# Patient Record
Sex: Male | Born: 1980 | Race: White | Hispanic: No | Marital: Married | Smoking: Never smoker
Health system: Southern US, Community
[De-identification: ages and names within clinical notes are randomized; demographics above are authoritative.]

## PROBLEM LIST (undated history)

## (undated) DIAGNOSIS — E559 Vitamin D deficiency, unspecified: Secondary | ICD-10-CM

## (undated) DIAGNOSIS — I10 Essential (primary) hypertension: Secondary | ICD-10-CM

## (undated) DIAGNOSIS — F419 Anxiety disorder, unspecified: Secondary | ICD-10-CM

## (undated) DIAGNOSIS — F1010 Alcohol abuse, uncomplicated: Secondary | ICD-10-CM

## (undated) DIAGNOSIS — F10982 Alcohol use, unspecified with alcohol-induced sleep disorder: Secondary | ICD-10-CM

## (undated) HISTORY — DX: Essential (primary) hypertension: I10

## (undated) HISTORY — DX: Anxiety disorder, unspecified: F41.9

## (undated) HISTORY — PX: NO PAST SURGERIES: SHX2092

## (undated) HISTORY — DX: Alcohol abuse, uncomplicated: F10.10

## (undated) HISTORY — DX: Alcohol use, unspecified with alcohol-induced sleep disorder: F10.982

## (undated) HISTORY — DX: Vitamin D deficiency, unspecified: E55.9

---

## 2017-10-20 ENCOUNTER — Encounter (HOSPITAL_BASED_OUTPATIENT_CLINIC_OR_DEPARTMENT_OTHER): Payer: Self-pay | Admitting: Emergency Medicine

## 2017-10-20 ENCOUNTER — Emergency Department (HOSPITAL_BASED_OUTPATIENT_CLINIC_OR_DEPARTMENT_OTHER)
Admission: EM | Admit: 2017-10-20 | Discharge: 2017-10-20 | Disposition: A | Discharge status: Home / Self Care | Payer: 59 | Attending: Emergency Medicine | Admitting: Emergency Medicine

## 2017-10-20 ENCOUNTER — Other Ambulatory Visit: Payer: Self-pay

## 2017-10-20 DIAGNOSIS — T675XXA Heat exhaustion, unspecified, initial encounter: Secondary | ICD-10-CM | POA: Diagnosis not present

## 2017-10-20 DIAGNOSIS — Y9389 Activity, other specified: Secondary | ICD-10-CM | POA: Diagnosis not present

## 2017-10-20 DIAGNOSIS — X58XXXA Exposure to other specified factors, initial encounter: Secondary | ICD-10-CM | POA: Insufficient documentation

## 2017-10-20 DIAGNOSIS — L03114 Cellulitis of left upper limb: Secondary | ICD-10-CM | POA: Insufficient documentation

## 2017-10-20 DIAGNOSIS — Y92007 Garden or yard of unspecified non-institutional (private) residence as the place of occurrence of the external cause: Secondary | ICD-10-CM | POA: Diagnosis not present

## 2017-10-20 DIAGNOSIS — Y999 Unspecified external cause status: Secondary | ICD-10-CM | POA: Diagnosis not present

## 2017-10-20 DIAGNOSIS — R112 Nausea with vomiting, unspecified: Secondary | ICD-10-CM | POA: Insufficient documentation

## 2017-10-20 DIAGNOSIS — S61402A Unspecified open wound of left hand, initial encounter: Secondary | ICD-10-CM | POA: Diagnosis present

## 2017-10-20 LAB — CBC WITH DIFFERENTIAL/PLATELET
Basophils Absolute: 0 10*3/uL (ref 0.0–0.1)
Basophils Relative: 0 %
EOS PCT: 1 %
Eosinophils Absolute: 0.1 10*3/uL (ref 0.0–0.7)
HEMATOCRIT: 45.5 % (ref 39.0–52.0)
Hemoglobin: 16.4 g/dL (ref 13.0–17.0)
LYMPHS ABS: 1.6 10*3/uL (ref 0.7–4.0)
LYMPHS PCT: 22 %
MCH: 35.1 pg — AB (ref 26.0–34.0)
MCHC: 36 g/dL (ref 30.0–36.0)
MCV: 97.4 fL (ref 78.0–100.0)
MONO ABS: 1.2 10*3/uL — AB (ref 0.1–1.0)
Monocytes Relative: 17 %
NEUTROS ABS: 4.4 10*3/uL (ref 1.7–7.7)
Neutrophils Relative %: 60 %
PLATELETS: 201 10*3/uL (ref 150–400)
RBC: 4.67 MIL/uL (ref 4.22–5.81)
RDW: 12.7 % (ref 11.5–15.5)
WBC: 7.3 10*3/uL (ref 4.0–10.5)

## 2017-10-20 LAB — BASIC METABOLIC PANEL WITH GFR
Anion gap: 15 (ref 5–15)
BUN: 14 mg/dL (ref 6–20)
CO2: 24 mmol/L (ref 22–32)
Calcium: 10.1 mg/dL (ref 8.9–10.3)
Chloride: 93 mmol/L — ABNORMAL LOW (ref 101–111)
Creatinine, Ser: 1.1 mg/dL (ref 0.61–1.24)
GFR calc Af Amer: >60 mL/min
GFR calc non Af Amer: >60 mL/min
Glucose, Bld: 89 mg/dL (ref 65–99)
Potassium: 3.4 mmol/L — ABNORMAL LOW (ref 3.5–5.1)
Sodium: 132 mmol/L — ABNORMAL LOW (ref 135–145)

## 2017-10-20 MED ORDER — SODIUM CHLORIDE 0.9 % IV BOLUS
1000.0000 mL | Freq: Once | INTRAVENOUS | Status: AC
  Administered 2017-10-20: 1000 mL via INTRAVENOUS

## 2017-10-20 MED ORDER — SULFAMETHOXAZOLE-TRIMETHOPRIM 800-160 MG PO TABS
1.0000 | ORAL_TABLET | Freq: Once | ORAL | Status: AC
  Administered 2017-10-20: 1 via ORAL
  Filled 2017-10-20: qty 1

## 2017-10-20 MED ORDER — ONDANSETRON HCL 4 MG/2ML IJ SOLN
4.0000 mg | Freq: Once | INTRAMUSCULAR | Status: AC
  Administered 2017-10-20: 4 mg via INTRAVENOUS
  Filled 2017-10-20: qty 2

## 2017-10-20 MED ORDER — ONDANSETRON 4 MG PO TBDP: 4.0000 mg | ORAL_TABLET | Freq: Three times a day (TID) | ORAL | 0 refills | Status: DC | PRN

## 2017-10-20 MED ORDER — SULFAMETHOXAZOLE-TRIMETHOPRIM 800-160 MG PO TABS: 1.0000 | ORAL_TABLET | Freq: Two times a day (BID) | ORAL | 0 refills | Status: AC

## 2017-10-20 NOTE — ED Provider Notes (Signed)
[facility] EMERGENCY DEPARTMENT Provider Note   CSN: [id number] Arrival date & time: 10/20/17  1754     History   Chief Complaint Chief Complaint  Patient presents with  . Wound Check    HPI James Trujillo is a 37 y.o. male.  Pt presents to the ED today with a wound to his left hand and n/v.  Pt said he was outside yesterday in the hot sun all day.  He woke up this morning with n/v and headache.  He noticed that he had a possible spider bite to his left hand.  The pt said the redness around the bite is worsening.     History reviewed. No pertinent past medical history.  There are no active problems to display for this patient.   History reviewed. No pertinent surgical history.      Home Medications    Prior to Admission medications   Medication Sig Start Date End Date Taking? Authorizing Provider  ondansetron (ZOFRAN ODT) 4 MG disintegrating tablet Take 1 tablet (4 mg total) by mouth every 8 (eight) hours as needed. 10/20/17   Isla Pence, MD  sulfamethoxazole-trimethoprim (BACTRIM DS,SEPTRA DS) 800-160 MG tablet Take 1 tablet by mouth 2 (two) times daily for 7 days. 10/20/17 10/27/17  Isla Pence, MD    Family History No family history on file.  Social History Social History   Tobacco Use  . Smoking status: Never Smoker  . Smokeless tobacco: Never Used  Substance Use Topics  . Alcohol use: Yes    Alcohol/week: 21.6 oz    Types: 36 Standard drinks or equivalent per week    Comment: occ  . Drug use: Never     Allergies   Bee venom   Review of Systems Review of Systems  Gastrointestinal: Positive for nausea and vomiting.  Skin: Positive for wound.  All other systems reviewed and are negative.    Physical Exam Updated Vital Signs BP (!) 160/114 (BP Location: Left Arm)   Pulse 86   Temp 98.3 F (36.8 C) (Oral)   Resp 20   Ht 5\' 9"  (1.753 m)   Wt 83.9 kg (185 lb)   SpO2 98%   BMI 27.32 kg/m   Physical Exam  Constitutional:  He is oriented to person, place, and time. He appears well-developed and well-nourished.  HENT:  Head: Normocephalic and atraumatic.  Right Ear: External ear normal.  Left Ear: External ear normal.  Nose: Nose normal.  Mouth/Throat: Oropharynx is clear and moist.  Eyes: Pupils are equal, round, and reactive to light. Conjunctivae and EOM are normal.  Neck: Normal range of motion. Neck supple.  Cardiovascular: Normal rate, regular rhythm, normal heart sounds and intact distal pulses.  Pulmonary/Chest: Effort normal and breath sounds normal.  Abdominal: Soft. Bowel sounds are normal.  Musculoskeletal: Normal range of motion.  Neurological: He is alert and oriented to person, place, and time.  Skin: Skin is warm. Capillary refill takes less than 2 seconds.  Left hand cellulitis.  No abscess.  Psychiatric: He has a normal mood and affect. His behavior is normal. Judgment and thought content normal.  Nursing note and vitals reviewed.    ED Treatments / Results  Labs (all labs ordered are listed, but only abnormal results are displayed) Labs Reviewed  BASIC METABOLIC PANEL - Abnormal; Notable for the following components:      Result Value   Sodium 132 (*)    Potassium 3.4 (*)    Chloride 93 (*)  All other components within normal limits  CBC WITH DIFFERENTIAL/PLATELET - Abnormal; Notable for the following components:   MCH 35.1 (*)    Monocytes Absolute 1.2 (*)    All other components within normal limits    EKG None  Radiology No results found.  Procedures Procedures (including critical care time)  Medications Ordered in ED Medications  sodium chloride 0.9 % bolus 1,000 mL (1,000 mLs Intravenous New Bag/Given 10/20/17 1938)  sulfamethoxazole-trimethoprim (BACTRIM DS,SEPTRA DS) 800-160 MG per tablet 1 tablet (has no administration in time range)  ondansetron (ZOFRAN) injection 4 mg (4 mg Intravenous Given 10/20/17 1939)     Initial Impression / Assessment and Plan /  ED Course  I have reviewed the triage vital signs and the nursing notes.  Pertinent labs & imaging results that were available during my care of the patient were reviewed by me and considered in my medical decision making (see chart for details).     Pt is feeling much better.  He is tolerating po fluids.  He is instructed to return if worse.   Final Clinical Impressions(s) / ED Diagnoses   Final diagnoses:  Cellulitis of left upper extremity  Non-intractable vomiting with nausea, unspecified vomiting type  Heat exhaustion, initial encounter    ED Discharge Orders        Ordered    ondansetron (ZOFRAN ODT) 4 MG disintegrating tablet  Every 8 hours PRN     10/20/17 2006    sulfamethoxazole-trimethoprim (BACTRIM DS,SEPTRA DS) 800-160 MG tablet  2 times daily     10/20/17 2006       Isla Pence, MD 10/20/17 2010

## 2017-10-20 NOTE — ED Triage Notes (Signed)
Patient states that he woke up yesterday morning with a wound to his left hand. He is unsure if it a insect bite or not. THe patient also states that he is having some N/V/ and blurred vision with generalized aches

## 2018-10-14 ENCOUNTER — Other Ambulatory Visit: Payer: Self-pay

## 2018-10-14 ENCOUNTER — Emergency Department (HOSPITAL_BASED_OUTPATIENT_CLINIC_OR_DEPARTMENT_OTHER): Payer: 59

## 2018-10-14 ENCOUNTER — Encounter (HOSPITAL_BASED_OUTPATIENT_CLINIC_OR_DEPARTMENT_OTHER): Payer: Self-pay | Admitting: Emergency Medicine

## 2018-10-14 ENCOUNTER — Emergency Department (HOSPITAL_BASED_OUTPATIENT_CLINIC_OR_DEPARTMENT_OTHER)
Admission: EM | Admit: 2018-10-14 | Discharge: 2018-10-14 | Disposition: A | Discharge status: Home / Self Care | Payer: 59 | Attending: Emergency Medicine | Admitting: Emergency Medicine

## 2018-10-14 DIAGNOSIS — Z20828 Contact with and (suspected) exposure to other viral communicable diseases: Secondary | ICD-10-CM | POA: Diagnosis not present

## 2018-10-14 DIAGNOSIS — Z20822 Contact with and (suspected) exposure to covid-19: Secondary | ICD-10-CM

## 2018-10-14 DIAGNOSIS — R509 Fever, unspecified: Secondary | ICD-10-CM | POA: Diagnosis present

## 2018-10-14 DIAGNOSIS — R0602 Shortness of breath: Secondary | ICD-10-CM | POA: Diagnosis not present

## 2018-10-14 DIAGNOSIS — R05 Cough: Secondary | ICD-10-CM | POA: Insufficient documentation

## 2018-10-14 DIAGNOSIS — R0789 Other chest pain: Secondary | ICD-10-CM | POA: Insufficient documentation

## 2018-10-14 LAB — CBC
HCT: 47.6 % (ref 39.0–52.0)
Hemoglobin: 16.3 g/dL (ref 13.0–17.0)
MCH: 32.2 pg (ref 26.0–34.0)
MCHC: 34.2 g/dL (ref 30.0–36.0)
MCV: 94.1 fL (ref 80.0–100.0)
Platelets: 258 10*3/uL (ref 150–400)
RBC: 5.06 MIL/uL (ref 4.22–5.81)
RDW: 11.8 % (ref 11.5–15.5)
WBC: 8.2 10*3/uL (ref 4.0–10.5)
nRBC: 0 % (ref 0.0–0.2)

## 2018-10-14 LAB — BASIC METABOLIC PANEL
Anion gap: 16 — ABNORMAL HIGH (ref 5–15)
BUN: 7 mg/dL (ref 6–20)
CO2: 24 mmol/L (ref 22–32)
Calcium: 10.4 mg/dL — ABNORMAL HIGH (ref 8.9–10.3)
Chloride: 96 mmol/L — ABNORMAL LOW (ref 98–111)
Creatinine, Ser: 1 mg/dL (ref 0.61–1.24)
GFR calc Af Amer: >60 mL/min (ref >60)
GFR calc non Af Amer: >60 mL/min (ref >60)
Glucose, Bld: 118 mg/dL — ABNORMAL HIGH (ref 70–99)
Potassium: 3.3 mmol/L — ABNORMAL LOW (ref 3.5–5.1)
Sodium: 136 mmol/L (ref 135–145)

## 2018-10-14 LAB — D-DIMER, QUANTITATIVE (NOT AT ARMC): D-Dimer, Quant: 0.39 ug/mL-FEU (ref 0.00–0.50)

## 2018-10-14 LAB — TROPONIN I: Troponin I: <0.03 ng/mL (ref <0.03)

## 2018-10-14 MED ORDER — HYDROXYZINE HCL 25 MG PO TABS: 25.0000 mg | ORAL_TABLET | Freq: Every evening | ORAL | 0 refills | Status: DC | PRN

## 2018-10-14 MED ORDER — KETOROLAC TROMETHAMINE 30 MG/ML IJ SOLN
30.0000 mg | Freq: Once | INTRAMUSCULAR | Status: AC
  Administered 2018-10-14: 03:00:00 30 mg via INTRAVENOUS

## 2018-10-14 MED ORDER — SODIUM CHLORIDE 0.9% FLUSH
3.0000 mL | Freq: Once | INTRAVENOUS | Status: DC
  Filled 2018-10-14: qty 3

## 2018-10-14 MED ORDER — ONDANSETRON 4 MG PO TBDP: 4.0000 mg | ORAL_TABLET | Freq: Three times a day (TID) | ORAL | 0 refills | Status: DC | PRN

## 2018-10-14 MED ORDER — KETOROLAC TROMETHAMINE 30 MG/ML IJ SOLN
30.0000 mg | Freq: Once | INTRAMUSCULAR | Status: DC
  Filled 2018-10-14: qty 1

## 2018-10-14 NOTE — ED Notes (Signed)
Pt was given a urinal incase he needed to void. Pt was also given phone if staff needs to call and update him. He was handed the tv remote and lights dim to help milu.

## 2018-10-14 NOTE — ED Notes (Signed)
Pt understood dc material. NAD Noted. Scripts given at [employer]. All questions answered to satisfaction. Pt escorted to check out counter

## 2018-10-14 NOTE — ED Triage Notes (Signed)
C/o chest burning in center and to the left of chest that started today.  Reports being sick for the last two weeks with chills, fever, night sweats, and some cp with sob.

## 2018-10-14 NOTE — Discharge Instructions (Addendum)
You were seen today for chest pain.  Your work-up is reassuring.  Your COVID testing is pending.  Continue to isolate at home.  Symptomatically treat yourself.  Make sure to stay hydrated.  If you develop new or worsening symptoms you should be reevaluated.

## 2018-10-14 NOTE — ED Provider Notes (Signed)
[facility] EMERGENCY DEPARTMENT Provider Note   CSN: [id number] Arrival date & time: 10/14/18  0222    History   Chief Complaint Chief Complaint  Patient presents with   Chest Pain    HPI James Trujillo is a 38 y.o. male.     HPI  This is a 38 year old male who presents with chest pain.  Patient reports 2-week history of upper respiratory symptoms including fevers, cough, night sweats.  He states "I think I probably have COVID."  He reports recently traveling to [place] to take care of her friend who had a broken leg.  He states that the friend stayed in hospital and in a rehab facility.  No documented COVID exposures.  Patient has been isolating for the last 2 weeks.  Reports today he developed chest pain.  He states that it is burning in nature in the center of the chest.  He describes it as nonradiating.  Currently he rates his pain at 6 out of 10.  He has not had any recent fevers.  Denies any lower extremity swelling.  Did drive to [place].  Denies any history of heart disease, hypertension, diabetes.  History reviewed. No pertinent past medical history.  There are no active problems to display for this patient.   History reviewed. No pertinent surgical history.      Home Medications    Prior to Admission medications   Medication Sig Start Date End Date Taking? Authorizing Provider  hydrOXYzine (ATARAX/VISTARIL) 25 MG tablet Take 1 tablet (25 mg total) by mouth at bedtime as needed. 10/14/18   Richanda Darin, Barbette Hair, MD  ondansetron (ZOFRAN ODT) 4 MG disintegrating tablet Take 1 tablet (4 mg total) by mouth every 8 (eight) hours as needed. 10/14/18   Juleah Paradise, Barbette Hair, MD    Family History No family history on file.  Social History Social History   Tobacco Use   Smoking status: Never Smoker   Smokeless tobacco: Never Used  Substance Use Topics   Alcohol use: Yes    Alcohol/week: 36.0 standard drinks    Types: 36 Standard drinks or equivalent per  week    Comment: occ   Drug use: Never     Allergies   Bee venom   Review of Systems Review of Systems  Constitutional: Positive for chills and fever.  Respiratory: Positive for cough and shortness of breath.   Cardiovascular: Positive for chest pain. Negative for leg swelling.  Gastrointestinal: Negative for abdominal pain, nausea and vomiting.  Genitourinary: Negative for dysuria.  Musculoskeletal: Positive for myalgias.  Neurological: Negative for headaches.  All other systems reviewed and are negative.    Physical Exam Updated Vital Signs BP (!) 158/109    Pulse 90    Temp 98.6 F (37 C) (Oral)    Resp 18    Ht 1.753 m (5\' 9" )    Wt 83.9 kg    SpO2 97%    BMI 27.32 kg/m   Physical Exam Vitals signs and nursing note reviewed.  Constitutional:      Appearance: He is well-developed. He is not ill-appearing.  HENT:     Head: Normocephalic and atraumatic.  Eyes:     Pupils: Pupils are equal, round, and reactive to light.  Neck:     Musculoskeletal: Neck supple.  Cardiovascular:     Rate and Rhythm: Normal rate and regular rhythm.     Heart sounds: Normal heart sounds. No murmur.  Pulmonary:     Effort: Pulmonary  effort is normal. No tachypnea or respiratory distress.     Breath sounds: Normal breath sounds. No wheezing.  Abdominal:     General: Bowel sounds are normal.     Palpations: Abdomen is soft.     Tenderness: There is no abdominal tenderness. There is no rebound.  Musculoskeletal:     Right lower leg: He exhibits no tenderness. No edema.     Left lower leg: He exhibits no tenderness. No edema.  Lymphadenopathy:     Cervical: No cervical adenopathy.  Skin:    General: Skin is warm and dry.  Neurological:     Mental Status: He is alert and oriented to person, place, and time.  Psychiatric:        Mood and Affect: Mood normal.      ED Treatments / Results  Labs (all labs ordered are listed, but only abnormal results are displayed) Labs Reviewed    BASIC METABOLIC PANEL - Abnormal; Notable for the following components:      Result Value   Potassium 3.3 (*)    Chloride 96 (*)    Glucose, Bld 118 (*)    Calcium 10.4 (*)    Anion gap 16 (*)    All other components within normal limits  NOVEL CORONAVIRUS, NAA (HOSPITAL ORDER, SEND-OUT TO REF LAB)  CBC  TROPONIN I  D-DIMER, QUANTITATIVE (NOT AT [facility])    EKG EKG Interpretation  Date/Time:  Monday Oct 14 2018 02:34:08 EDT Ventricular Rate:  100 PR Interval:    QRS Duration: 83 QT Interval:  333 QTC Calculation: 430 R Axis:   64 Text Interpretation:  Sinus tachycardia Confirmed by Thayer Jew [phone]) on 10/14/2018 2:35:42 AM   Radiology Dg Chest Portable 1 View  Result Date: 10/14/2018 CLINICAL DATA:  Chest pain and shortness of breath. EXAM: PORTABLE CHEST 1 VIEW COMPARISON:  None. FINDINGS: Normal heart size and mediastinal contours. No acute infiltrate or edema. No effusion or pneumothorax. No acute osseous findings. IMPRESSION: Negative chest. Electronically Signed   By: Monte Fantasia M.D.   On: 10/14/2018 04:06    Procedures Procedures (including critical care time)  Medications Ordered in ED Medications  sodium chloride flush (NS) 0.9 % injection 3 mL (3 mLs Intravenous Not Given 10/14/18 0242)  ketorolac (TORADOL) 30 MG/ML injection 30 mg (30 mg Intravenous Given 10/14/18 6213)     Initial Impression / Assessment and Plan / ED Course  I have reviewed the triage vital signs and the nursing notes.  Pertinent labs & imaging results that were available during my care of the patient were reviewed by me and considered in my medical decision making (see chart for details).        Patient presents with chest pain.  Also concern for possible COVID infection.  He has visited a high risk area.  He is overall nontoxic-appearing.  He is afebrile.  Vital signs are largely reassuring.  He is not ill-appearing.  Physical exam is benign.  Considerations include acute  upper respiratory infection, could have an infection, PE given recent travel.  Less likely ACS.  Pneumonia is also consideration.  EKG shows no evidence of arrhythmia or ischemia.  Chest x-ray shows no evidence of pneumothorax or pneumonia.  Troponin is negative.  D-dimer is negative.  BMP and CBC are both largely reassuring.  He does have a mild anion gap but this is likely related to low chloride.  No other significant metabolic derangements.  Patient was updated.  COVID testing is  pending.  Recommend continued isolation and supportive measures at home.  After history, exam, and medical workup I feel the patient has been appropriately medically screened and is safe for discharge home. Pertinent diagnoses were discussed with the patient. Patient was given return precautions.  Abdulla Pooley was evaluated in Emergency Department on 10/14/2018 for the symptoms described in the history of present illness. He was evaluated in the context of the global COVID-19 pandemic, which necessitated consideration that the patient might be at risk for infection with the SARS-CoV-2 virus that causes COVID-19. Institutional protocols and algorithms that pertain to the evaluation of patients at risk for COVID-19 are in a state of rapid change based on information released by regulatory bodies including the CDC and federal and state organizations. These policies and algorithms were followed during the patient's care in the ED.   Final Clinical Impressions(s) / ED Diagnoses   Final diagnoses:  Atypical chest pain  Suspected Covid-19 Virus Infection    ED Discharge Orders         Ordered    ondansetron (ZOFRAN ODT) 4 MG disintegrating tablet  Every 8 hours PRN     10/14/18 0425    hydrOXYzine (ATARAX/VISTARIL) 25 MG tablet  At bedtime PRN     10/14/18 0425           Merryl Hacker, MD 10/14/18 0430

## 2018-10-14 NOTE — ED Notes (Signed)
X-ray at bedside

## 2018-10-15 LAB — NOVEL CORONAVIRUS, NAA (HOSP ORDER, SEND-OUT TO REF LAB; TAT 18-24 HRS): SARS-CoV-2, NAA: NOT DETECTED

## 2020-08-24 IMAGING — DX PORTABLE CHEST - 1 VIEW
1 series · 1 of 1 positions shown · non-contrast
Comparison: None.

CLINICAL DATA: Chest pain and shortness of breath.

EXAM:
PORTABLE CHEST 1 VIEW

[chest ap]
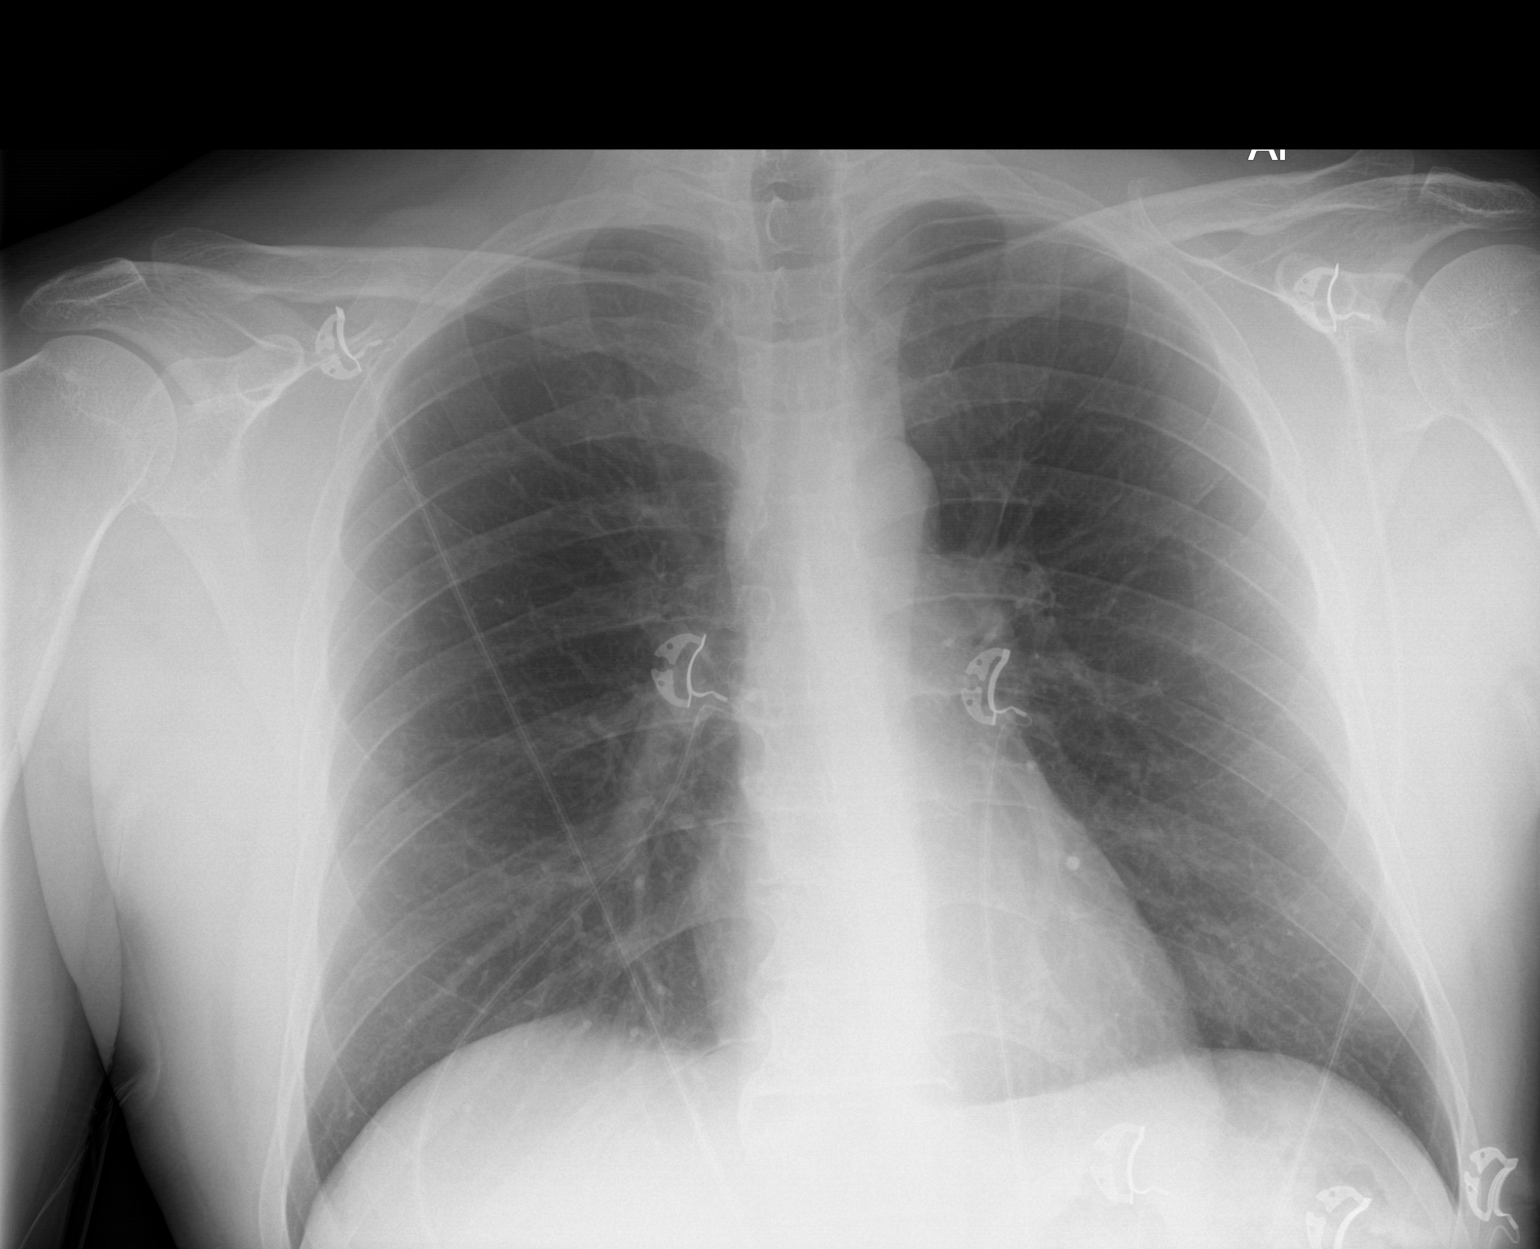

[1 of 1 positions shown; findings below may reference images not displayed]

FINDINGS: Normal heart size and mediastinal contours. No acute infiltrate or
edema. No effusion or pneumothorax. No acute osseous findings.
IMPRESSION: Negative chest.

## 2020-09-11 ENCOUNTER — Other Ambulatory Visit: Payer: Self-pay

## 2020-09-11 ENCOUNTER — Emergency Department (HOSPITAL_BASED_OUTPATIENT_CLINIC_OR_DEPARTMENT_OTHER)
Admission: EM | Admit: 2020-09-11 | Discharge: 2020-09-11 | Disposition: A | Discharge status: Home / Self Care | Payer: 59 | Attending: Emergency Medicine | Admitting: Emergency Medicine

## 2020-09-11 ENCOUNTER — Encounter (HOSPITAL_BASED_OUTPATIENT_CLINIC_OR_DEPARTMENT_OTHER): Payer: Self-pay | Admitting: Emergency Medicine

## 2020-09-11 DIAGNOSIS — I10 Essential (primary) hypertension: Secondary | ICD-10-CM | POA: Insufficient documentation

## 2020-09-11 DIAGNOSIS — R519 Headache, unspecified: Secondary | ICD-10-CM | POA: Diagnosis present

## 2020-09-11 MED ORDER — AMLODIPINE BESYLATE 5 MG PO TABS: 5.0000 mg | ORAL_TABLET | Freq: Every day | ORAL | 1 refills | Status: DC

## 2020-09-11 NOTE — ED Triage Notes (Signed)
Reports having dental work a few times the last 5 weeks.  BP elevated at dental office.  Also realized he has had mild headache and blurred vision for the last few months.  Unsure if related.

## 2020-09-11 NOTE — ED Provider Notes (Signed)
[facility] EMERGENCY DEPARTMENT Provider Note   CSN: [id number] Arrival date & time: 09/11/20  1543     History Chief Complaint  Patient presents with  . Hypertension  . Headache    James Trujillo is a 40 y.o. male.  The history is provided by the patient.  Hypertension This is a new problem. The current episode started more than 1 week ago. The problem occurs daily. Associated symptoms include headaches. Pertinent negatives include no chest pain, no abdominal pain and no shortness of breath. Nothing aggravates the symptoms. Nothing relieves the symptoms. He has tried nothing for the symptoms. The treatment provided no relief.       History reviewed. No pertinent past medical history.  There are no problems to display for this patient.   History reviewed. No pertinent surgical history.     No family history on file.  Social History   Tobacco Use  . Smoking status: Never Smoker  . Smokeless tobacco: Never Used  Substance Use Topics  . Alcohol use: Yes    Alcohol/week: 36.0 standard drinks    Types: 36 Standard drinks or equivalent per week    Comment: occ  . Drug use: Never    Home Medications Prior to Admission medications   Medication Sig Start Date End Date Taking? Authorizing Provider  amLODipine (NORVASC) 5 MG tablet Take 1 tablet (5 mg total) by mouth daily. 09/11/20 10/11/20 Yes Melonee Gerstel, DO  hydrOXYzine (ATARAX/VISTARIL) 25 MG tablet Take 1 tablet (25 mg total) by mouth at bedtime as needed. 10/14/18   Horton, Barbette Hair, MD  ondansetron (ZOFRAN ODT) 4 MG disintegrating tablet Take 1 tablet (4 mg total) by mouth every 8 (eight) hours as needed. 10/14/18   Horton, Barbette Hair, MD    Allergies    Bee venom  Review of Systems   Review of Systems  Constitutional: Negative for chills and fever.  HENT: Negative for ear pain and sore throat.   Eyes: Negative for pain and visual disturbance.  Respiratory: Negative for cough and shortness of  breath.   Cardiovascular: Negative for chest pain and palpitations.  Gastrointestinal: Negative for abdominal pain and vomiting.  Genitourinary: Negative for dysuria and hematuria.  Musculoskeletal: Negative for arthralgias and back pain.  Skin: Negative for color change and rash.  Neurological: Positive for headaches. Negative for seizures and syncope.  All other systems reviewed and are negative.   Physical Exam Updated Vital Signs  ED Triage Vitals  Enc Vitals Group     BP 09/11/20 1551 (!) 174/123     Pulse Rate 09/11/20 1551 91     Resp 09/11/20 1551 18     Temp 09/11/20 1551 98.4 F (36.9 C)     Temp Source 09/11/20 1551 Oral     SpO2 09/11/20 1551 100 %     Weight 09/11/20 1552 180 lb (81.6 kg)     Height 09/11/20 1552 5\' 9"  (1.753 m)     Head Circumference --      Peak Flow --      Pain Score 09/11/20 1552 4     Pain Loc --      Pain Edu? --      Excl. in [place]? --     Physical Exam Vitals and nursing note reviewed.  Constitutional:      General: He is not in acute distress.    Appearance: He is well-developed. He is not ill-appearing.  HENT:     Head: Normocephalic and atraumatic.  Mouth/Throat:     Mouth: Mucous membranes are moist.  Eyes:     Extraocular Movements: Extraocular movements intact.     Right eye: Normal extraocular motion and no nystagmus.     Left eye: Normal extraocular motion and no nystagmus.     Conjunctiva/sclera: Conjunctivae normal.     Pupils:     Right eye: Pupil is reactive.     Left eye: Pupil is reactive.  Cardiovascular:     Rate and Rhythm: Normal rate and regular rhythm.     Heart sounds: Normal heart sounds. No murmur heard.   Pulmonary:     Effort: Pulmonary effort is normal. No respiratory distress.     Breath sounds: Normal breath sounds.  Abdominal:     Palpations: Abdomen is soft.     Tenderness: There is no abdominal tenderness.  Musculoskeletal:     Cervical back: Normal range of motion and neck supple.   Skin:    General: Skin is warm and dry.  Neurological:     Mental Status: He is alert and oriented to person, place, and time.     Cranial Nerves: No cranial nerve deficit.     Sensory: No sensory deficit.     Motor: No weakness.     Comments: 5+ out of 5 strength throughout, normal sensation, no drift, normal finger-nose-finger, normal speech  Psychiatric:        Mood and Affect: Mood normal.     ED Results / Procedures / Treatments   Labs (all labs ordered are listed, but only abnormal results are displayed) Labs Reviewed - No data to display  EKG None  Radiology No results found.  Procedures Procedures   Medications Ordered in ED Medications - No data to display  ED Course  I have reviewed the triage vital signs and the nursing notes.  Pertinent labs & imaging results that were available during my care of the patient were reviewed by me and considered in my medical decision making (see chart for details).    MDM Rules/Calculators/A&P                          James Trujillo is a 40 year old male who presents to the ED with high blood pressure.  Blood pressure 174/120.  Blood pressure has been elevated multiple times over the last week greater than 150/90.  Has had some intermittent headaches during this time.  Has no stroke symptoms on exam.  Normal neurological exam.  No chest pain.  Overall likely primary blood pressure issue.  We will start him on amlodipine given the multiple elevated blood pressures.  Patient does have significant alcohol use.  Spent a good amount of time talking to him about his alcohol use and dietary modifications.  Eats a high salt diet as well.  Overall believe he will improve with dietary modifications, lifestyle improvements and will start amlodipine.  Given information to follow-up with primary care.  Understands return precautions and discharged in ED in good condition.  This chart was dictated using voice recognition software.  Despite best  efforts to proofread,  errors can occur which can change the documentation meaning.    Final Clinical Impression(s) / ED Diagnoses Final diagnoses:  Hypertension, unspecified type    Rx / DC Orders ED Discharge Orders         Ordered    amLODipine (NORVASC) 5 MG tablet  Daily        09/11/20 1613  Lennice Sites, DO 09/11/20 1620

## 2020-09-13 ENCOUNTER — Telehealth: Payer: Self-pay

## 2020-09-13 NOTE — Telephone Encounter (Signed)
Patient wondering if you would take him as a new pt.

## 2020-09-13 NOTE — Telephone Encounter (Signed)
That is okay, needs to be seen within the next 10 days for ER follow-up

## 2020-09-13 NOTE — Telephone Encounter (Signed)
Please advise 

## 2020-09-20 NOTE — Telephone Encounter (Signed)
lvm to schedule appt.  

## 2020-09-29 NOTE — Telephone Encounter (Signed)
lvm to schedule an appt 

## 2020-10-01 DIAGNOSIS — I10 Essential (primary) hypertension: Secondary | ICD-10-CM | POA: Insufficient documentation

## 2020-10-04 ENCOUNTER — Other Ambulatory Visit: Payer: Self-pay

## 2020-10-04 ENCOUNTER — Ambulatory Visit (INDEPENDENT_AMBULATORY_CARE_PROVIDER_SITE_OTHER): Payer: 59 | Admitting: Family Medicine

## 2020-10-04 ENCOUNTER — Encounter: Payer: Self-pay | Admitting: Family Medicine

## 2020-10-04 VITALS — BP 122/82 | HR 83 | Temp 97.9°F | Ht 69.0 in | Wt 186.2 lb

## 2020-10-04 DIAGNOSIS — Z789 Other specified health status: Secondary | ICD-10-CM | POA: Insufficient documentation

## 2020-10-04 DIAGNOSIS — I10 Essential (primary) hypertension: Secondary | ICD-10-CM

## 2020-10-04 DIAGNOSIS — Z7289 Other problems related to lifestyle: Secondary | ICD-10-CM | POA: Diagnosis not present

## 2020-10-04 LAB — COMPREHENSIVE METABOLIC PANEL
ALT: 50 U/L (ref 0–53)
AST: 40 U/L — ABNORMAL HIGH (ref 0–37)
Albumin: 4.8 g/dL (ref 3.5–5.2)
Alkaline Phosphatase: 68 U/L (ref 39–117)
BUN: 11 mg/dL (ref 6–23)
CO2: 27 mEq/L (ref 19–32)
Calcium: 9.4 mg/dL (ref 8.4–10.5)
Chloride: 101 mEq/L (ref 96–112)
Creatinine, Ser: 0.84 mg/dL (ref 0.40–1.50)
GFR: 109.01 mL/min (ref >60.00)
Glucose, Bld: 85 mg/dL (ref 70–99)
Potassium: 4.3 mEq/L (ref 3.5–5.1)
Sodium: 139 mEq/L (ref 135–145)
Total Bilirubin: 1 mg/dL (ref 0.2–1.2)
Total Protein: 7.5 g/dL (ref 6.0–8.3)

## 2020-10-04 NOTE — Progress Notes (Signed)
Chief Complaint  Patient presents with  . Hospitalization Follow-up  . Hypertension       New Patient Visit SUBJECTIVE: HPI: James Trujillo is an 40 y.o.male who is being seen for establishing care.  Hypertension Patient presents for hypertension follow up. He does monitor home blood pressures. Blood pressures ranging on average from 120's/80's. He is compliant with medication- Norvasc 5 mg/d. Patient has these side effects of medication: none He is sometimes adhering to a healthy diet overall. Exercise: none No CP or SOB.   Alcohol use Over the past 2 years, the patient has increased his alcohol use.  He ranges from 2-7 drinks nightly, with him consuming around 4-5 drinks nightly on average.  Some days he will not drink at all, usually 1 or 2 days of the week.  He does not have any sweating, shaking, or intense cravings.  He has not had a primary care physician in several years, last blood work was done 2 years ago that did show elevated liver enzymes.  Past Medical History:  Diagnosis Date  . Essential hypertension    Past Surgical History:  Procedure Laterality Date  . NO PAST SURGERIES     Family History  Problem Relation Age of Onset  . Hypertension Mother   . Melanoma Mother   . Hypertension Father   . Prostate cancer Father 36  . Asthma Father   . Asthma Brother    Allergies  Allergen Reactions  . Bee Venom Swelling    Current Outpatient Medications:  .  amLODipine (NORVASC) 5 MG tablet, Take 1 tablet (5 mg total) by mouth daily., Disp: 30 tablet, Rfl: 1  OBJECTIVE: BP 122/82 (BP Location: Left Arm, Patient Position: Sitting, Cuff Size: Normal)   Pulse 83   Temp 97.9 F (36.6 C) (Oral)   Ht 5\' 9"  (1.753 m)   Wt 186 lb 4 oz (84.5 kg)   SpO2 99%   BMI 27.50 kg/m  General:  well developed, well nourished, in no apparent distress Skin:  no significant moles, warts, or growths Lungs:  clear to auscultation, breath sounds equal bilaterally, no respiratory  distress Cardio:  regular rate and rhythm, no LE edema or bruits Psych: well oriented with normal range of affect and appropriate judgment/insight  ASSESSMENT/PLAN: Essential hypertension - Plan: Comprehensive metabolic panel  Alcohol use  1.  Chronic, new to this provider.  Check labs.  Continue Norvasc 5 mg daily.  Counseled on diet and exercise. DASH information provided.  2.  Chronic, needing to cut down.  I would like him to start cutting down his alcohol intake by half every 2 weeks.  Once he is to 1, maybe 2 drinks daily, he can stop.  If he starts having withdrawal symptoms, he will let me know. Patient should return as originally scheduled with his future regular PCP. The patient voiced understanding and agreement to the plan.   Hazel [place], DO 10/04/20  12:07 PM

## 2020-10-04 NOTE — Patient Instructions (Addendum)
Aim to do some physical exertion for 150 minutes per week. This is typically divided into 5 days per week, 30 minutes per day. The activity should be enough to get your heart rate up. Anything is better than nothing if you have time constraints.  Let's cut down on alcohol intake by half every 2 weeks until you are at 1-2 drinks daily and then stop.  If you have any issues with this, please let me know.   Give [place] 2-3 business days to get the results of your labs back.   Let us know if you need anything.   [email or web address]">  DASH Eating Plan DASH stands for Dietary Approaches to Stop Hypertension. The DASH eating plan is a healthy eating plan that has been shown to:  Reduce high blood pressure (hypertension).  Reduce your risk for type 2 diabetes, heart disease, and stroke.  Help with weight loss. What are tips for following this plan? Reading food labels  Check food labels for the amount of salt (sodium) per serving. Choose foods with less than 5 percent of the Daily Value of sodium. Generally, foods with less than 300 milligrams (mg) of sodium per serving fit into this eating plan.  To find whole grains, look for the word "whole" as the first word in the ingredient list. Shopping  Buy products labeled as "low-sodium" or "no salt added."  Buy fresh foods. Avoid canned foods and pre-made or frozen meals. Cooking  Avoid adding salt when cooking. Use salt-free seasonings or herbs instead of table salt or sea salt. Check with your health care provider or pharmacist before using salt substitutes.  Do not fry foods. Cook foods using healthy methods such as baking, boiling, grilling, roasting, and broiling instead.  Cook with heart-healthy oils, such as olive, canola, avocado, soybean, or sunflower oil. Meal planning  Eat a balanced diet that includes: ? 4 or more servings of fruits and 4 or more servings of vegetables each day. Try to  fill one-half of your plate with fruits and vegetables. ? 6-8 servings of whole grains each day. ? Less than 6 oz (170 g) of lean meat, poultry, or fish each day. A 3-oz (85-g) serving of meat is about the same size as a deck of cards. One egg equals 1 oz (28 g). ? 2-3 servings of low-fat dairy each day. One serving is 1 cup (237 mL). ? 1 serving of nuts, seeds, or beans 5 times each week. ? 2-3 servings of heart-healthy fats. Healthy fats called omega-3 fatty acids are found in foods such as walnuts, flaxseeds, fortified milks, and eggs. These fats are also found in cold-water fish, such as sardines, salmon, and mackerel.  Limit how much you eat of: ? Canned or prepackaged foods. ? Food that is high in trans fat, such as some fried foods. ? Food that is high in saturated fat, such as fatty meat. ? Desserts and other sweets, sugary drinks, and other foods with added sugar. ? Full-fat dairy products.  Do not salt foods before eating.  Do not eat more than 4 egg yolks a week.  Try to eat at least 2 vegetarian meals a week.  Eat more home-cooked food and less restaurant, buffet, and fast food.   Lifestyle  When eating at a restaurant, ask that your food be prepared with less salt or no salt, if possible.  If you drink alcohol: ? Limit how much you use to:  0-1 drink a day for women who are not  pregnant.  0-2 drinks a day for men. ? Be aware of how much alcohol is in your drink. In the [place] one drink equals one 12 oz bottle of beer (355 mL), one 5 oz glass of wine (148 mL), or one 1 oz glass of hard liquor (44 mL). General information  Avoid eating more than 2,300 mg of salt a day. If you have hypertension, you may need to reduce your sodium intake to 1,500 mg a day.  Work with your health care provider to maintain a healthy body weight or to lose weight. Ask what an ideal weight is for you.  Get at least 30 minutes of exercise that causes your heart to beat faster (aerobic  exercise) most days of the week. Activities may include walking, swimming, or biking.  Work with your health care provider or dietitian to adjust your eating plan to your individual calorie needs. What foods should I eat? Fruits All fresh, dried, or frozen fruit. Canned fruit in natural juice (without added sugar). Vegetables Fresh or frozen vegetables (raw, steamed, roasted, or grilled). Low-sodium or reduced-sodium tomato and vegetable juice. Low-sodium or reduced-sodium tomato sauce and tomato paste. Low-sodium or reduced-sodium canned vegetables. Grains Whole-grain or whole-wheat bread. Whole-grain or whole-wheat pasta. Brown rice. [place]. Bulgur. Whole-grain and low-sodium cereals. Pita bread. Low-fat, low-sodium crackers. Whole-wheat flour tortillas. Meats and other proteins Skinless chicken or [place]. Ground chicken or [place]. Pork with fat trimmed off. Fish and seafood. Egg whites. Dried beans, peas, or lentils. Unsalted nuts, nut butters, and seeds. Unsalted canned beans. Lean cuts of beef with fat trimmed off. Low-sodium, lean precooked or cured meat, such as sausages or meat loaves. Dairy Low-fat (1%) or fat-free (skim) milk. Reduced-fat, low-fat, or fat-free cheeses. Nonfat, low-sodium ricotta or cottage cheese. Low-fat or nonfat yogurt. Low-fat, low-sodium cheese. Fats and oils Soft margarine without trans fats. Vegetable oil. Reduced-fat, low-fat, or light mayonnaise and salad dressings (reduced-sodium). Canola, safflower, olive, avocado, soybean, and sunflower oils. Avocado. Seasonings and condiments Herbs. Spices. Seasoning mixes without salt. Other foods Unsalted popcorn and pretzels. Fat-free sweets. The items listed above may not be a complete list of foods and beverages you can eat. Contact a dietitian for more information. What foods should I avoid? Fruits Canned fruit in a light or heavy syrup. Fried fruit. Fruit in cream or butter sauce. Vegetables Creamed or  fried vegetables. Vegetables in a cheese sauce. Regular canned vegetables (not low-sodium or reduced-sodium). Regular canned tomato sauce and paste (not low-sodium or reduced-sodium). Regular tomato and vegetable juice (not low-sodium or reduced-sodium). Angie Fava. Olives. Grains Baked goods made with fat, such as croissants, muffins, or some breads. Dry pasta or rice meal packs. Meats and other proteins Fatty cuts of meat. Ribs. Fried meat. Berniece Salines. Bologna, salami, and other precooked or cured meats, such as sausages or meat loaves. Fat from the back of a pig (fatback). Bratwurst. Salted nuts and seeds. Canned beans with added salt. Canned or smoked fish. Whole eggs or egg yolks. Chicken or [place] with skin. Dairy Whole or 2% milk, cream, and half-and-half. Whole or full-fat cream cheese. Whole-fat or sweetened yogurt. Full-fat cheese. Nondairy creamers. Whipped toppings. Processed cheese and cheese spreads. Fats and oils Butter. Stick margarine. Lard. Shortening. Ghee. Bacon fat. Tropical oils, such as coconut, palm kernel, or palm oil. Seasonings and condiments Onion salt, garlic salt, seasoned salt, table salt, and sea salt. Worcestershire sauce. Tartar sauce. Barbecue sauce. Teriyaki sauce. Soy sauce, including reduced-sodium. Steak sauce. Canned and packaged gravies. Fish  sauce. Oyster sauce. Cocktail sauce. Store-bought horseradish. Ketchup. Mustard. Meat flavorings and tenderizers. Bouillon cubes. Hot sauces. Pre-made or packaged marinades. Pre-made or packaged taco seasonings. Relishes. Regular salad dressings. Other foods Salted popcorn and pretzels. The items listed above may not be a complete list of foods and beverages you should avoid. Contact a dietitian for more information. Where to find more information  National Heart, Lung, and Blood Institute: https://wilson-eaton.com/  American Heart Association: www.heart.org  Academy of Nutrition and Dietetics: www.eatright.[place]: www.kidney.org Summary  The DASH eating plan is a healthy eating plan that has been shown to reduce high blood pressure (hypertension). It may also reduce your risk for type 2 diabetes, heart disease, and stroke.  When on the DASH eating plan, aim to eat more fresh fruits and vegetables, whole grains, lean proteins, low-fat dairy, and heart-healthy fats.  With the DASH eating plan, you should limit salt (sodium) intake to 2,300 mg a day. If you have hypertension, you may need to reduce your sodium intake to 1,500 mg a day.  Work with your health care provider or dietitian to adjust your eating plan to your individual calorie needs. This information is not intended to replace advice given to you by your health care provider. Make sure you discuss any questions you have with your health care provider. Document Revised: 04/18/2019 Document Reviewed: 04/18/2019 Elsevier Patient Education  2021 [employer].

## 2020-11-09 ENCOUNTER — Ambulatory Visit (INDEPENDENT_AMBULATORY_CARE_PROVIDER_SITE_OTHER): Payer: 59 | Admitting: Internal Medicine

## 2020-11-09 ENCOUNTER — Other Ambulatory Visit: Payer: Self-pay

## 2020-11-09 VITALS — BP 126/84 | HR 81 | Temp 98.1°F | Resp 16 | Ht 69.0 in | Wt 189.0 lb

## 2020-11-09 DIAGNOSIS — Z114 Encounter for screening for human immunodeficiency virus [HIV]: Secondary | ICD-10-CM | POA: Diagnosis not present

## 2020-11-09 DIAGNOSIS — Z0000 Encounter for general adult medical examination without abnormal findings: Secondary | ICD-10-CM | POA: Insufficient documentation

## 2020-11-09 DIAGNOSIS — Z1159 Encounter for screening for other viral diseases: Secondary | ICD-10-CM | POA: Diagnosis not present

## 2020-11-09 DIAGNOSIS — I10 Essential (primary) hypertension: Secondary | ICD-10-CM | POA: Diagnosis not present

## 2020-11-09 DIAGNOSIS — Z09 Encounter for follow-up examination after completed treatment for conditions other than malignant neoplasm: Secondary | ICD-10-CM | POA: Insufficient documentation

## 2020-11-09 LAB — LIPID PANEL
Cholesterol: 189 mg/dL (ref 0–200)
HDL: 69.5 mg/dL (ref >39.00)
LDL Cholesterol: 101 mg/dL — ABNORMAL HIGH (ref 0–99)
NonHDL: 119.74
Total CHOL/HDL Ratio: 3
Triglycerides: 93 mg/dL (ref 0.0–149.0)
VLDL: 18.6 mg/dL (ref 0.0–40.0)

## 2020-11-09 LAB — CBC WITH DIFFERENTIAL/PLATELET
Basophils Absolute: 0 10*3/uL (ref 0.0–0.1)
Basophils Relative: 0.6 % (ref 0.0–3.0)
Eosinophils Absolute: 0.1 10*3/uL (ref 0.0–0.7)
Eosinophils Relative: 2.1 % (ref 0.0–5.0)
HCT: 43.3 % (ref 39.0–52.0)
Hemoglobin: 14.6 g/dL (ref 13.0–17.0)
Lymphocytes Relative: 44.7 % (ref 12.0–46.0)
Lymphs Abs: 1.6 10*3/uL (ref 0.7–4.0)
MCHC: 33.7 g/dL (ref 30.0–36.0)
MCV: 98 fl (ref 78.0–100.0)
Monocytes Absolute: 0.4 10*3/uL (ref 0.1–1.0)
Monocytes Relative: 10.4 % (ref 3.0–12.0)
Neutro Abs: 1.5 10*3/uL (ref 1.4–7.7)
Neutrophils Relative %: 42.2 % — ABNORMAL LOW (ref 43.0–77.0)
Platelets: 224 10*3/uL (ref 150.0–400.0)
RBC: 4.42 Mil/uL (ref 4.22–5.81)
RDW: 12.9 % (ref 11.5–15.5)
WBC: 3.7 10*3/uL — ABNORMAL LOW (ref 4.0–10.5)

## 2020-11-09 LAB — TSH: TSH: 1.87 u[IU]/mL (ref 0.35–4.50)

## 2020-11-09 MED ORDER — AMLODIPINE BESYLATE 5 MG PO TABS: 5.0000 mg | ORAL_TABLET | Freq: Every day | ORAL | 3 refills | Status: DC

## 2020-11-09 NOTE — Assessment & Plan Note (Signed)
Tdap: We will get records Covid vax: none, had covid infex dx x 2, vax pro>>cons, encouraged to proceed. CCS: no FH, never had aCscope FH prostate ca, GF and F at age 40.  No symptoms Labs: FLP, CBC, TSH, HIV, hep C Diet exercise: Doing great with diet, encouraged to exercise more

## 2020-11-09 NOTE — Progress Notes (Signed)
Subjective:    Patient ID: James Trujillo, male    DOB: 04/20/1981, 40 y.o.   MRN: [id number]  DOS:  11/09/2020 Type of visit - description:  To get  establish, requests a CPX   The patient was accepted by me but initially saw Dr. Nani Ravens on 10/04/2020. At the time he was evaluated for hypertension.  CMP was done and it was normal except for a slightly elevated AST (40).  Was noted to have induration on the left hand, this started few years ago and is slowly growing.  No symptoms  Review of Systems  Other than above, a 14 point review of systems is negative      Past Medical History:  Diagnosis Date   Essential hypertension    ETOH abuse     Past Surgical History:  Procedure Laterality Date   NO PAST SURGERIES     Social History   Socioeconomic History   Marital status: Divorced    Spouse name: Not on file   Number of children: 0   Years of education: Not on file   Highest education level: Not on file  Occupational History   Occupation: Freight forwarder,  Chief Financial Officer  Tobacco Use   Smoking status: Never   Smokeless tobacco: Never  Substance and Sexual Activity   Alcohol use: Not Currently    Comment: 3-4 drinks a day   Drug use: Never   Sexual activity: Yes    Partners: Female  Other Topics Concern   Not on file  Social History Narrative   Moved from [place] ~ 2019   Lives by self    Social Determinants of Health   Financial Resource Strain: Not on file  Food Insecurity: Not on file  Transportation Needs: Not on file  Physical Activity: Not on file  Stress: Not on file  Social Connections: Not on file  Intimate Partner Violence: Not on file    Allergies as of 11/09/2020       Reactions   Bee Venom Swelling   Ciprofloxacin Other (See Comments)   tendonitis        Medication List        Accurate as of November 09, 2020  6:11 PM. If you have any questions, ask your nurse or doctor.          amLODipine 5 MG tablet Commonly known as:  NORVASC Take 1 tablet (5 mg total) by mouth daily.           Objective:   Physical Exam BP 126/84 (BP Location: Left Arm, Patient Position: Sitting, Cuff Size: Small)   Pulse 81   Temp 98.1 F (36.7 C) (Oral)   Resp 16   Ht 5\' 9"  (1.753 m)   Wt 189 lb (85.7 kg)   SpO2 96%   BMI 27.91 kg/m  General: Well developed, NAD, BMI noted Neck: No  thyromegaly  HEENT:  Normocephalic . Face symmetric, atraumatic Lungs:  CTA B Normal respiratory effort, no intercostal retractions, no accessory muscle use. Heart: RRR,  no murmur.  Abdomen:  Not distended, soft, non-tender. No rebound or rigidity.   Lower extremities: no pretibial edema bilaterally L hand: hard tissue proximal to fourth finger.  No mobility issues. Skin: Exposed areas without rash. Not pale. Not jaundice Neurologic:  alert & oriented X3.  Speech normal, gait appropriate for age and unassisted Strength symmetric and appropriate for age.  Psych: Cognition and judgment appear intact.  Cooperative with normal attention span and concentration.  Behavior  appropriate. No anxious or depressed appearing.     Assessment    Assessment (new patient 10/2020) HTN dx 08/2020 H/o heavy ETOH , going through a divorce FH prostate ca, F age 10 Covid infex x 2 (last 05/2020) Dupuytren's  PLAN Here for CPX HTN: BP was noted to be elevated at his dentist and at home in the last few months, went to the ER 08-2020, started amlodipine.  Since then BP is well controlled and he feels great. EKG today: NSR Plan: Refill amlodipine. EtOH: History of abuse, has cut down significantly in the last year, still drinking more than 2 drinks daily, patient is counseled. Dupuytren's: Few years history of very hard tissue of the left hand, suspect Dupuytren's, she has no symptoms, we agree on wait for now, refer to hand surgery at some point. RTC 1 year  This visit occurred during the SARS-CoV-2 public health emergency.  Safety protocols were  in place, including screening questions prior to the visit, additional usage of staff PPE, and extensive cleaning of exam room while observing appropriate contact time as indicated for disinfecting solutions.

## 2020-11-09 NOTE — Patient Instructions (Addendum)
Check the  blood pressure   monthly  BP GOAL is between 110/65 and  135/85. If it is consistently higher or lower, let me know   GO TO THE LAB : Get the blood work     [place], [place] back for   for a physical by 10/2021

## 2020-11-09 NOTE — Assessment & Plan Note (Signed)
Here for CPX HTN: BP was noted to be elevated at his dentist and at home in the last few months, went to the ER 08-2020, started amlodipine.  Since then BP is well controlled and he feels great. EKG today: NSR Plan: Refill amlodipine. EtOH: History of abuse, has cut down significantly in the last year, still drinking more than 2 drinks daily, patient is counseled. Dupuytren's: Few years history of very hard tissue of the left hand, suspect Dupuytren's, she has no symptoms, we agree on wait for now, refer to hand surgery at some point. RTC 1 year

## 2020-11-10 LAB — HIV ANTIBODY (ROUTINE TESTING W REFLEX): HIV 1&2 Ab, 4th Generation: NONREACTIVE

## 2020-11-10 LAB — HEPATITIS C ANTIBODY
Hepatitis C Ab: NONREACTIVE
SIGNAL TO CUT-OFF: 0 (ref <1.00)

## 2020-12-24 ENCOUNTER — Telehealth: Payer: Self-pay

## 2020-12-24 NOTE — Telephone Encounter (Signed)
Received medical records from Dr. Frazier Butt Pole Ojea in [place], [place]. Records placed in PCP yellow folder for review.

## 2020-12-30 NOTE — Telephone Encounter (Signed)
Multiple pages of records reviewed, many were from 2011. He does have a history of low vitamin D. No recent lab

## 2020-12-30 NOTE — Telephone Encounter (Signed)
Records sent for scanning

## 2021-10-19 ENCOUNTER — Encounter: Payer: Self-pay | Admitting: Internal Medicine

## 2021-12-08 ENCOUNTER — Other Ambulatory Visit: Payer: Self-pay

## 2021-12-08 ENCOUNTER — Emergency Department (HOSPITAL_BASED_OUTPATIENT_CLINIC_OR_DEPARTMENT_OTHER): Payer: 59

## 2021-12-08 ENCOUNTER — Emergency Department (HOSPITAL_BASED_OUTPATIENT_CLINIC_OR_DEPARTMENT_OTHER)
Admission: EM | Admit: 2021-12-08 | Discharge: 2021-12-09 | Disposition: A | Discharge status: Home / Self Care | Payer: 59 | Attending: Emergency Medicine | Admitting: Emergency Medicine

## 2021-12-08 ENCOUNTER — Encounter (HOSPITAL_BASED_OUTPATIENT_CLINIC_OR_DEPARTMENT_OTHER): Payer: Self-pay | Admitting: Emergency Medicine

## 2021-12-08 DIAGNOSIS — Z79899 Other long term (current) drug therapy: Secondary | ICD-10-CM | POA: Insufficient documentation

## 2021-12-08 DIAGNOSIS — R079 Chest pain, unspecified: Secondary | ICD-10-CM | POA: Diagnosis present

## 2021-12-08 DIAGNOSIS — R0789 Other chest pain: Secondary | ICD-10-CM | POA: Insufficient documentation

## 2021-12-08 DIAGNOSIS — I10 Essential (primary) hypertension: Secondary | ICD-10-CM | POA: Diagnosis not present

## 2021-12-08 LAB — BASIC METABOLIC PANEL
Anion gap: 10 (ref 5–15)
BUN: 13 mg/dL (ref 6–20)
CO2: 24 mmol/L (ref 22–32)
Calcium: 9.6 mg/dL (ref 8.9–10.3)
Chloride: 100 mmol/L (ref 98–111)
Creatinine, Ser: 0.91 mg/dL (ref 0.61–1.24)
GFR, Estimated: >60 mL/min (ref >60)
Glucose, Bld: 102 mg/dL — ABNORMAL HIGH (ref 70–99)
Potassium: 3.8 mmol/L (ref 3.5–5.1)
Sodium: 134 mmol/L — ABNORMAL LOW (ref 135–145)

## 2021-12-08 LAB — CBC
HCT: 46.6 % (ref 39.0–52.0)
Hemoglobin: 16.2 g/dL (ref 13.0–17.0)
MCH: 33.5 pg (ref 26.0–34.0)
MCHC: 34.8 g/dL (ref 30.0–36.0)
MCV: 96.3 fL (ref 80.0–100.0)
Platelets: 148 10*3/uL — ABNORMAL LOW (ref 150–400)
RBC: 4.84 MIL/uL (ref 4.22–5.81)
RDW: 12.8 % (ref 11.5–15.5)
WBC: 5.2 10*3/uL (ref 4.0–10.5)
nRBC: 0 % (ref 0.0–0.2)

## 2021-12-08 LAB — TROPONIN I (HIGH SENSITIVITY): Troponin I (High Sensitivity): 4 ng/L (ref <18)

## 2021-12-08 NOTE — ED Provider Notes (Signed)
[facility] Provider Note: Georgena Spurling, MD, FACEP  CSN: [id number] MRN: [id number] ARRIVAL: 12/08/21 at 2125 ROOM: [place]  Chest Pain   HISTORY OF PRESENT ILLNESS  12/08/21 11:58 PM James Trujillo is a 41 y.o. male who took a flight from [place] about 4 PM today.  The pain was very hot and he was sweating.  While on the plane he developed a slowly pulsating pain below his left nipple.  It was never severe and has subsequently improved after taking ibuprofen.  He thinks it was initially worse with deep breathing but is not sure because the sweaty airplane made it difficult to breathe anyway.  He is not having any pain or swelling in his legs.  He did recently move from [place].    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)    Anxiety    Essential hypertension    ETOH abuse    Vitamin D deficiency     Past Surgical History:  Procedure Laterality Date   NO PAST SURGERIES      Family History  Problem Relation Age of Onset   Hypertension Mother    Melanoma Mother    Prostate cancer Father 16   Hypertension Father    Asthma Father    Asthma Brother     Social History   Tobacco Use   Smoking status: Never   Smokeless tobacco: Never  Vaping Use   Vaping Use: Never used  Substance Use Topics   Alcohol use: Yes    Comment: 3-4 drinks a day   Drug use: Never    Prior to Admission medications   Medication Sig Start Date End Date Taking? Authorizing Provider  amLODipine (NORVASC) 5 MG tablet Take 1 tablet (5 mg total) by mouth daily. 11/09/20   Colon Branch, MD    Allergies Bee venom and Ciprofloxacin   REVIEW OF SYSTEMS  Negative except as noted here or in the History of Present Illness.   PHYSICAL EXAMINATION  Initial Vital Signs Blood pressure (!) 155/113, pulse 67, temperature 98.7 F (37.1 C), temperature source Oral, resp. rate 20, height '5\' 9"'$  (1.753 m), weight 83.9 kg, SpO2  100 %.  Examination General: Well-developed, well-nourished male in no acute distress; appearance consistent with age of record HENT: normocephalic; atraumatic Eyes: Normal appearance Neck: supple Heart: regular rate and rhythm Lungs: clear to auscultation bilaterally Abdomen: soft; nondistended; nontender; bowel sounds present Extremities: No deformity; full range of motion; pulses normal; no edema or calf tenderness Neurologic: Awake, alert and oriented; motor function intact in all extremities and symmetric; no facial droop Skin: Warm and dry Psychiatric: Normal mood and affect   RESULTS  Summary of this visit's results, reviewed and interpreted by myself:   EKG Interpretation  Date/Time:  Thursday December 08 2021 21:39:34 EDT Ventricular Rate:  70 PR Interval:  128 QRS Duration: 84 QT Interval:  408 QTC Calculation: 440 R Axis:   54 Text Interpretation: Normal sinus rhythm Nonspecific T wave abnormality Confirmed by Exavier Lina [phone]) on 12/08/2021 11:58:24 PM       Laboratory Studies: Results for orders placed or performed during the hospital encounter of 12/08/21 (from the past 24 hour(s))  Basic metabolic panel     Status: Abnormal   Collection Time: 12/08/21  9:41 PM  Result Value Ref Range   Sodium 134 (L) 135 - 145 mmol/L   Potassium 3.8 3.5 - 5.1 mmol/L  Chloride 100 98 - 111 mmol/L   CO2 24 22 - 32 mmol/L   Glucose, Bld 102 (H) 70 - 99 mg/dL   BUN 13 6 - 20 mg/dL   Creatinine, Ser 0.91 0.61 - 1.24 mg/dL   Calcium 9.6 8.9 - 10.3 mg/dL   GFR, Estimated >60 >60 mL/min   Anion gap 10 5 - 15  CBC     Status: Abnormal   Collection Time: 12/08/21  9:41 PM  Result Value Ref Range   WBC 5.2 4.0 - 10.5 K/uL   RBC 4.84 4.22 - 5.81 MIL/uL   Hemoglobin 16.2 13.0 - 17.0 g/dL   HCT 46.6 39.0 - 52.0 %   MCV 96.3 80.0 - 100.0 fL   MCH 33.5 26.0 - 34.0 pg   MCHC 34.8 30.0 - 36.0 g/dL   RDW 12.8 11.5 - 15.5 %   Platelets 148 (L) 150 - 400 K/uL   nRBC 0.0 0.0 - 0.2  %  Troponin I (High Sensitivity)     Status: None   Collection Time: 12/08/21  9:41 PM  Result Value Ref Range   Troponin I (High Sensitivity) 4 <18 ng/L  Troponin I (High Sensitivity)     Status: None   Collection Time: 12/09/21  1:09 AM  Result Value Ref Range   Troponin I (High Sensitivity) 4 <18 ng/L  D-dimer, quantitative     Status: None   Collection Time: 12/09/21  1:09 AM  Result Value Ref Range   D-Dimer, Quant 0.37 0.00 - 0.50 ug/mL-FEU   Imaging Studies: DG Chest 2 View  Result Date: 12/08/2021 CLINICAL DATA:  Chest pain. EXAM: CHEST - 2 VIEW COMPARISON:  Chest radiograph dated 10/14/2018. FINDINGS: The heart size and mediastinal contours are within normal limits. Both lungs are clear. The visualized skeletal structures are unremarkable. IMPRESSION: No active cardiopulmonary disease. Electronically Signed   By: Anner Crete M.D.   On: 12/08/2021 21:49    ED COURSE and MDM  Nursing notes, initial and subsequent vitals signs, including pulse oximetry, reviewed and interpreted by myself.  Vitals:   12/08/21 2135 12/08/21 2136 12/09/21 0030  BP:  (!) 155/113 (!) 153/111  Pulse:  67 69  Resp:  20 10  Temp:  98.7 F (37.1 C)   TempSrc:  Oral   SpO2:  100% 100%  Weight: 83.9 kg    Height: '5\' 9"'$  (1.753 m)     Medications - No data to display  Patient's pain is atypical for cardiac etiology.  His EKG is nonacute and his troponins are 4.  His D-dimer is within normal limits.  I believe he is safe for discharge at this time. HEART score 1.  PROCEDURES  Procedures   ED DIAGNOSES     ICD-10-CM   1. Atypical chest pain  R07.89          Shanon Rosser, MD 12/09/21 [phone]

## 2021-12-08 NOTE — ED Provider Notes (Incomplete)
[facility] Provider Note: Georgena Spurling, MD, FACEP  CSN: [id number] MRN: [id number] ARRIVAL: 12/08/21 at 2125 ROOM: [place]  Chest Pain   HISTORY OF PRESENT ILLNESS  12/08/21 11:58 PM James Trujillo is a 41 y.o. male    Past Medical History:  Diagnosis Date  . 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 Anxiety   . Essential hypertension   . ETOH abuse   . Vitamin D deficiency     Past Surgical History:  Procedure Laterality Date  . NO PAST SURGERIES      Family History  Problem Relation Age of Onset  . Hypertension Mother   . Melanoma Mother   . Prostate cancer Father 50  . Hypertension Father   . Asthma Father   . Asthma Brother     Social History   Tobacco Use  . Smoking status: Never  . Smokeless tobacco: Never  Vaping Use  . Vaping Use: Never used  Substance Use Topics  . Alcohol use: Yes    Comment: 3-4 drinks a day  . Drug use: Never    Prior to Admission medications   Medication Sig Start Date End Date Taking? Authorizing Provider  amLODipine (NORVASC) 5 MG tablet Take 1 tablet (5 mg total) by mouth daily. 11/09/20   Colon Branch, MD    Allergies Bee venom and Ciprofloxacin   REVIEW OF SYSTEMS  Negative except as noted here or in the History of Present Illness.   PHYSICAL EXAMINATION  Initial Vital Signs Blood pressure (!) 155/113, pulse 67, temperature 98.7 F (37.1 C), temperature source Oral, resp. rate 20, height '5\' 9"'$  (1.753 m), weight 83.9 kg, SpO2 100 %.  Examination General: Well-developed, well-nourished male in no acute distress; appearance consistent with age of record HENT: normocephalic; atraumatic Eyes: pupils equal, round and reactive to light; extraocular muscles intact Neck: supple Heart: regular rate and rhythm; no murmurs, rubs or gallops Lungs: clear to auscultation bilaterally Abdomen: soft; nondistended; nontender; no masses or hepatosplenomegaly; bowel sounds  present Extremities: No deformity; full range of motion; pulses normal Neurologic: Awake, alert and oriented; motor function intact in all extremities and symmetric; no facial droop Skin: Warm and dry Psychiatric: Normal mood and affect   RESULTS  Summary of this visit's results, reviewed and interpreted by myself:   EKG Interpretation  Date/Time:  Thursday December 08 2021 21:39:34 EDT Ventricular Rate:  70 PR Interval:  128 QRS Duration: 84 QT Interval:  408 QTC Calculation: 440 R Axis:   54 Text Interpretation: Normal sinus rhythm Nonspecific T wave abnormality Confirmed by Dekendrick Uzelac, Jenny Reichmann [phone]) on 12/08/2021 11:58:24 PM       Laboratory Studies: Results for orders placed or performed during the hospital encounter of 12/08/21 (from the past 24 hour(s))  Basic metabolic panel     Status: Abnormal   Collection Time: 12/08/21  9:41 PM  Result Value Ref Range   Sodium 134 (L) 135 - 145 mmol/L   Potassium 3.8 3.5 - 5.1 mmol/L   Chloride 100 98 - 111 mmol/L   CO2 24 22 - 32 mmol/L   Glucose, Bld 102 (H) 70 - 99 mg/dL   BUN 13 6 - 20 mg/dL   Creatinine, Ser 0.91 0.61 - 1.24 mg/dL   Calcium 9.6 8.9 - 10.3 mg/dL   GFR, Estimated >60 >60 mL/min   Anion gap 10 5 - 15  CBC     Status: Abnormal   Collection Time: 12/08/21  9:41 PM  Result Value Ref Range   WBC 5.2 4.0 - 10.5 K/uL   RBC 4.84 4.22 - 5.81 MIL/uL   Hemoglobin 16.2 13.0 - 17.0 g/dL   HCT 46.6 39.0 - 52.0 %   MCV 96.3 80.0 - 100.0 fL   MCH 33.5 26.0 - 34.0 pg   MCHC 34.8 30.0 - 36.0 g/dL   RDW 12.8 11.5 - 15.5 %   Platelets 148 (L) 150 - 400 K/uL   nRBC 0.0 0.0 - 0.2 %  Troponin I (High Sensitivity)     Status: None   Collection Time: 12/08/21  9:41 PM  Result Value Ref Range   Troponin I (High Sensitivity) 4 <18 ng/L   Imaging Studies: DG Chest 2 View  Result Date: 12/08/2021 CLINICAL DATA:  Chest pain. EXAM: CHEST - 2 VIEW COMPARISON:  Chest radiograph dated 10/14/2018. FINDINGS: The heart size and mediastinal  contours are within normal limits. Both lungs are clear. The visualized skeletal structures are unremarkable. IMPRESSION: No active cardiopulmonary disease. Electronically Signed   By: Anner Crete M.D.   On: 12/08/2021 21:49    ED COURSE and MDM  Nursing notes, initial and subsequent vitals signs, including pulse oximetry, reviewed and interpreted by myself.  Vitals:   12/08/21 2135 12/08/21 2136  BP:  (!) 155/113  Pulse:  67  Resp:  20  Temp:  98.7 F (37.1 C)  TempSrc:  Oral  SpO2:  100%  Weight: 83.9 kg   Height: '5\' 9"'$  (1.753 m)    Medications - No data to display    PROCEDURES  Procedures   ED DIAGNOSES  No diagnosis found.

## 2021-12-08 NOTE — ED Triage Notes (Signed)
Patient reports left-sided chest pain prior to taking off on a plane earlier, states pain has decreased, but is still present.

## 2021-12-09 LAB — TROPONIN I (HIGH SENSITIVITY): Troponin I (High Sensitivity): 4 ng/L (ref <18)

## 2021-12-09 LAB — D-DIMER, QUANTITATIVE: D-Dimer, Quant: 0.37 ug/mL-FEU (ref 0.00–0.50)

## 2021-12-21 ENCOUNTER — Other Ambulatory Visit: Payer: Self-pay | Admitting: Internal Medicine

## 2022-01-06 ENCOUNTER — Encounter: Payer: Self-pay | Admitting: Internal Medicine

## 2022-01-06 ENCOUNTER — Ambulatory Visit (INDEPENDENT_AMBULATORY_CARE_PROVIDER_SITE_OTHER): Payer: 59 | Admitting: Internal Medicine

## 2022-01-06 VITALS — BP 124/82 | HR 90 | Temp 98.7°F | Ht 69.0 in | Wt 192.8 lb

## 2022-01-06 DIAGNOSIS — Z789 Other specified health status: Secondary | ICD-10-CM

## 2022-01-06 DIAGNOSIS — M720 Palmar fascial fibromatosis [Dupuytren]: Secondary | ICD-10-CM | POA: Diagnosis not present

## 2022-01-06 DIAGNOSIS — D229 Melanocytic nevi, unspecified: Secondary | ICD-10-CM

## 2022-01-06 DIAGNOSIS — F419 Anxiety disorder, unspecified: Secondary | ICD-10-CM | POA: Diagnosis not present

## 2022-01-06 MED ORDER — CITALOPRAM HYDROBROMIDE 20 MG PO TABS: ORAL_TABLET | ORAL | 3 refills | Status: DC

## 2022-01-06 MED ORDER — AMLODIPINE BESYLATE 5 MG PO TABS
5.0000 mg | ORAL_TABLET | Freq: Every day | ORAL | 3 refills | Status: DC
Start: 2022-01-06 — End: 2023-01-30

## 2022-01-06 NOTE — Patient Instructions (Addendum)
Start Celexa (citalopram) 20 mg tablets: Half tablet at bedtime the first week Then 1 tablet at bedtime. Watch for side effects.  Check the  blood pressure regularly BP GOAL is between 110/65 and  135/85. If it is consistently higher or lower, let me know  See you in September for your physical

## 2022-01-06 NOTE — Progress Notes (Unsigned)
Subjective:    Patient ID: James Trujillo, male    DOB: [birth year], 41 y.o.   MRN: [id number]  DOS:  01/06/2022 Type of visit - description: Follow-up  Here for hypertension follow-up.  Needs a refill. Reports great ambulatory BPs.  Also: Dupuytren's is getting worse, referral? Noted a lump at the posterior scalp, only 2 to 3 months ago.  No discharge, no bleeding.  Anxiety: That is started approximately a couple of years ago by the time he got divorced, anything cut "set that off", he feels restless, anxious, nervous. Many family members have the same symptoms and they do well with Celexa Denies depression or suicidal ideas He thinks that anxiety is one of the reason he is overdrinking.  I did notice he went to the ER 12/08/2021 with chest pain,  D-dimer and troponin negative.  Chest x-ray negative.  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 HEENT:  Normocephalic . Face symmetric, atraumatic   Skin: Not pale. Not jaundice Neurologic:  alert & oriented X3.  Speech normal, gait appropriate for age and unassisted Psych--  Cognition and judgment appear intact.  Cooperative with normal attention span and concentration.  Behavior appropriate. No anxious or depressed appearing.      Assessment     Assessment (new patient 10/2020) HTN dx 08/2020 H/o heavy ETOH , going through a divorce FH prostate ca, F age  47 Covid infex x 2 (last 05/2020) Dupuytren's  PLAN HTN: Reports ambulatory BPs are always within normal.  Refill amlodipine Dupuytren's: Reports is getting worse and would like to explore nonsurgical therapies.  Refer to Ortho hand. Anxiety: This is the first time he mention it to me, symptoms started around the time he got divorced, denies depression no suicidal ideas, family member responded very well to Celexa, trial?. I agree, start Celexa 10 mg then 20 mg, see AVS, watch for side effects. See next EtOH: He thinks that treating anxiety will significantly help decrease alcohol intake.  He was completely sober last month but in the last couple of weeks he is drinking every night, once he started SSRIs recommend to stop EtOH or if he Mast do not go over 1 or 2 drinks.  He verbalized understanding. Skin lesion: See physical exam, new skin lesion, the area has been a scratch, mole?  Refer to dermatology. CPX schedule for next month   Here for CPX HTN: BP was noted to be elevated at his dentist and at home in the last few months, went to the ER 08-2020, started amlodipine.  Since then BP is well controlled and he feels great. EKG today: NSR Plan: Refill amlodipine. EtOH: History of abuse, has cut down significantly in the last  year, still drinking more than 2 drinks daily, patient is counseled. Dupuytren's: Few years history of very hard tissue of the left hand, suspect Dupuytren's, she has no symptoms, we agree on wait for now, refer to hand surgery at some point. RTC 1 year

## 2022-01-07 DIAGNOSIS — F419 Anxiety disorder, unspecified: Secondary | ICD-10-CM | POA: Insufficient documentation

## 2022-01-07 NOTE — Assessment & Plan Note (Signed)
HTN: Reports ambulatory BPs are always within normal.  Refill amlodipine Dupuytren's: Reports is getting worse and would like to explore nonsurgical therapies.  Refer to Ortho hand. Anxiety: This is the first time he mention it to me, symptoms started around the time he got divorced, denies depression no suicidal ideas, family member responded very well to Celexa, trial?. I agree, start Celexa 10 mg then 20 mg, see AVS, watch for side effects. See next EtOH: He thinks that treating anxiety will significantly help decrease alcohol intake.  He was completely sober last month but in the last couple of weeks he is drinking every night, once he starts SSRIs recommend to stop EtOH (or if he must, don't go over 1-2 servings). He verbalized understanding. Skin lesion: See physical exam, new skin lesion, the area has been a scratch, mole?  Refer to dermatology. CPX schedule for next month

## 2022-01-12 ENCOUNTER — Ambulatory Visit: Payer: 59 | Admitting: Orthopedic Surgery

## 2022-01-16 ENCOUNTER — Ambulatory Visit: Payer: 59 | Admitting: Orthopedic Surgery

## 2022-01-19 ENCOUNTER — Encounter: Payer: Self-pay | Admitting: Orthopedic Surgery

## 2022-01-19 ENCOUNTER — Ambulatory Visit (INDEPENDENT_AMBULATORY_CARE_PROVIDER_SITE_OTHER): Payer: 59 | Admitting: Orthopedic Surgery

## 2022-01-19 DIAGNOSIS — M720 Palmar fascial fibromatosis [Dupuytren]: Secondary | ICD-10-CM

## 2022-01-19 NOTE — Progress Notes (Signed)
Office Visit Note   Patient: James Trujillo           Date of Birth: 09/21/1980           MRN: [id number] Visit Date: 01/19/2022              Requested by: Colon Branch, [address] [place],   [id number] PCP: Colon Branch, MD   Assessment & Plan: Visit Diagnoses:  1. Dupuytren contracture     Plan: Patient has very mild Dupuytren's disease involving the left hand.  He has a palpable pretendinous cord and small nodule in line with the ring finger.  There is a very very small pit in the area.  There is no evidence of contracture.  We discussed the nature of Dupuytren's disease as well as the utility of the tabletop test.  At this point I would not recommend any treatment given that he has very mild disease and evidence of contracture.  He will keep an eye on his fingers and will return if his disease worsens.  Follow-Up Instructions: No follow-ups on file.   Orders:  No orders of the defined types were placed in this encounter.  No orders of the defined types were placed in this encounter.     Procedures: No procedures performed   Clinical Data: No additional findings.   Subjective: Chief Complaint  Patient presents with   Left Ring Finger - nodule   Right Ring Finger - nodule   Right Little Finger - nodule   nodule     This is a 41 year old right-hand-dominant male who presents with nodules in the left hand that were first noticed for 5 years ago.  Seen by his PCP who referred him to me for further evaluation.  He does have a family history of Dupuytren's disease.  He does not have any difficulty with use of his hands.  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  Is able to use wrenches and other tools without any difficulty.  He has no pain in his hand.    Review of Systems   Objective: Vital Signs: There were no vitals taken for this visit.  Physical Exam Constitutional:      Appearance: Normal appearance.   Cardiovascular:     Rate and Rhythm: Normal rate.     Pulses: Normal pulses.  Pulmonary:     Effort: Pulmonary effort is normal.  Skin:    General: Skin is warm and dry.     Capillary Refill: Capillary refill takes less than 2 seconds.  Neurological:     Mental Status: He is alert.     Left Hand Exam   Tenderness  The patient is experiencing no tenderness.   Range of Motion  The patient has normal left wrist ROM.  Other  Erythema: absent Sensation: normal Pulse: present  Comments:  Small pretendinous cord in line with ring finger with small palpable nodule and very subtle pit.  No evidence of contracture with full finger ROM and negative table top test.       Specialty Comments:  No specialty comments available.  Imaging: No results found.   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[place])    Anxiety    Essential hypertension    ETOH abuse    Vitamin D deficiency     Family History  Problem Relation Age of Onset   Hypertension Mother    Melanoma Mother    Prostate cancer Father 61   Hypertension Father    Asthma Father    Asthma Brother     Past Surgical History:  Procedure Laterality Date   NO PAST SURGERIES     Social History   Occupational History   Occupation: Freight forwarder,  Chief Financial Officer  Tobacco Use   Smoking status: Never   Smokeless tobacco: Never  Vaping Use   Vaping Use: Never used  Substance and Sexual Activity   Alcohol use: Yes    Comment: 3-4 drinks a day   Drug use: Never   Sexual activity: Yes    Partners: Female

## 2022-02-20 ENCOUNTER — Ambulatory Visit (INDEPENDENT_AMBULATORY_CARE_PROVIDER_SITE_OTHER): Payer: 59 | Admitting: Internal Medicine

## 2022-02-20 ENCOUNTER — Encounter: Payer: Self-pay | Admitting: Internal Medicine

## 2022-02-20 VITALS — BP 126/82 | HR 86 | Temp 98.1°F | Resp 16 | Ht 69.0 in | Wt 189.2 lb

## 2022-02-20 DIAGNOSIS — Z0001 Encounter for general adult medical examination with abnormal findings: Secondary | ICD-10-CM

## 2022-02-20 DIAGNOSIS — I10 Essential (primary) hypertension: Secondary | ICD-10-CM

## 2022-02-20 DIAGNOSIS — F1010 Alcohol abuse, uncomplicated: Secondary | ICD-10-CM

## 2022-02-20 DIAGNOSIS — Z789 Other specified health status: Secondary | ICD-10-CM | POA: Diagnosis not present

## 2022-02-20 DIAGNOSIS — Z0000 Encounter for general adult medical examination without abnormal findings: Secondary | ICD-10-CM | POA: Diagnosis not present

## 2022-02-20 DIAGNOSIS — F1090 Alcohol use, unspecified, uncomplicated: Secondary | ICD-10-CM

## 2022-02-20 MED ORDER — BUSPIRONE HCL 7.5 MG PO TABS: 7.5000 mg | ORAL_TABLET | Freq: Two times a day (BID) | ORAL | 1 refills | Status: DC

## 2022-02-20 NOTE — Patient Instructions (Addendum)
Vaccines I recommend: Flu shot COVID-vaccine  See below the resources you have for the treatment of alcohol abuse.  For anxiety: BuSpar 7.5 mg 1 tablet twice daily     GO TO THE LAB : Get the blood work     [place], [place] back for   a checkup in 6 weeks     Recommend to reach out for help for the treatment of alcohol abuse  [facility] Address: [address] [place], [place] [address] Hours:  Open 24 hours Phone: [phone] [facility]  [facility]  [facility]: [place], [place] [address] [place]: [phone] or [phone]

## 2022-02-20 NOTE — Progress Notes (Unsigned)
Subjective:    Patient ID: James Trujillo, male    DOB: [birth year], 41 y.o.   MRN: [id number]  DOS:  02/20/2022 Type of visit - description: CPX Here for CPX Anxiety: Tried citalopram, it made him worse. Denies depression no suicidal ideas. Physically feeling well. 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[place])    Anxiety    Essential hypertension    ETOH abuse    Vitamin D deficiency     Past Surgical History:  Procedure Laterality Date   NO PAST SURGERIES      Current Outpatient Medications  Medication Instructions   amLODipine (NORVASC) 5 mg, Oral, Daily   citalopram (CELEXA) 20 MG tablet 1/2 tablet at night x 1 week, then 1 tablet at night       Objective:   Physical Exam BP 126/82   Pulse 86   Temp 98.1 F (36.7 C) (Oral)   Resp 16   Ht '5\' 9"'$  (1.753 m)   Wt 189 lb 4 oz (85.8 kg)   SpO2 96%   BMI 27.95 kg/m  General: Well developed, NAD, BMI noted Neck: No  thyromegaly  HEENT:  Normocephalic . Face symmetric, atraumatic + EtOH breath Lungs:  CTA B Normal respiratory effort, no intercostal retractions, no accessory muscle use. Heart: RRR,  no murmur.  Abdomen:  Not distended, soft, non-tender. No rebound or rigidity.   Lower extremities: no pretibial edema bilaterally  Skin: Exposed areas without rash. Not pale. Not jaundice Neurologic:  alert & oriented X3.  Speech normal, gait appropriate for age and unassisted Strength symmetric and appropriate for age.  Psych: Cognition and judgment appear intact.  Cooperative with normal attention span and concentration.  Behavior appropriate. No anxious or depressed appearing.     Assessment    ASSESSMENT (new patient 10/2020) HTN dx 08/2020  ETOH abuse FH prostate ca, F age 44 Covid infex x 2 (last 05/2020) Dupuytren's  PLAN Here for CPX HTN: On amlodipine, BP is good. Anxiety: Try it citalopram for  few days, states made his symptoms worse.  "The only thing that works is Xanax". Advised patient Xanax is contraindicated due to his history of alcohol abuse.  We talk about other options and we agreed on BuSpar 7.5 mg twice daily.  Reassess in 6 weeks. Alcohol abuse: The patient reports that he "drinks because he is anxious", advised he has a condition called alcoholism, is not just anxiety.  As such, in addition to treat anxiety he needs to seek for help to quit alcohol.  Several contacts provided, see AVS. He verbalized understanding and said "I know that". RTC 6 weeks.  Structure    Tdap 2014   Since I recommend: COVID, flu shot. Pros >cons CCS: no FH, never had a Cscope FH prostate ca, GF and F at age 25.  No symptoms Labs:  CMP, FLP, CBC, K16, folic acid Diet exercise: Discussed.  8=23 HTN: Reports ambulatory BPs are always within normal.  Refill amlodipine Dupuytren's: Reports is getting worse and would like to explore nonsurgical therapies.  Refer to Ortho hand. Anxiety: This is the first time he mention it to me, symptoms started around the time he got divorced, denies depression no suicidal ideas, family member responded very well to Celexa, trial?. I agree, start Celexa 10 mg then 20 mg, see AVS, watch for side effects. See next EtOH: He thinks that treating  anxiety will significantly help decrease alcohol intake.  He was completely sober last month but in the last couple of weeks he is drinking every night, once he starts SSRIs recommend to stop EtOH (or if he must, don't go over 1-2 servings). He verbalized understanding. Skin lesion: See physical exam, new skin lesion, the area has been a scratch, mole?  Refer to dermatology. CPX schedule for next month

## 2022-02-21 ENCOUNTER — Encounter: Payer: Self-pay | Admitting: Internal Medicine

## 2022-02-21 LAB — LIPID PANEL
Cholesterol: 230 mg/dL — ABNORMAL HIGH (ref 0–200)
HDL: 104.4 mg/dL (ref >39.00)
LDL Cholesterol: 116 mg/dL — ABNORMAL HIGH (ref 0–99)
NonHDL: 126.02
Total CHOL/HDL Ratio: 2
Triglycerides: 48 mg/dL (ref 0.0–149.0)
VLDL: 9.6 mg/dL (ref 0.0–40.0)

## 2022-02-21 LAB — CBC WITH DIFFERENTIAL/PLATELET
Basophils Absolute: 0.1 10*3/uL (ref 0.0–0.1)
Basophils Relative: 1.1 % (ref 0.0–3.0)
Eosinophils Absolute: 0 10*3/uL (ref 0.0–0.7)
Eosinophils Relative: 0.4 % (ref 0.0–5.0)
HCT: 46.5 % (ref 39.0–52.0)
Hemoglobin: 15.7 g/dL (ref 13.0–17.0)
Lymphocytes Relative: 45.1 % (ref 12.0–46.0)
Lymphs Abs: 2 10*3/uL (ref 0.7–4.0)
MCHC: 33.8 g/dL (ref 30.0–36.0)
MCV: 101.1 fl — ABNORMAL HIGH (ref 78.0–100.0)
Monocytes Absolute: 0.4 10*3/uL (ref 0.1–1.0)
Monocytes Relative: 9.9 % (ref 3.0–12.0)
Neutro Abs: 1.9 10*3/uL (ref 1.4–7.7)
Neutrophils Relative %: 43.5 % (ref 43.0–77.0)
Platelets: 155 10*3/uL (ref 150.0–400.0)
RBC: 4.59 Mil/uL (ref 4.22–5.81)
RDW: 14.1 % (ref 11.5–15.5)
WBC: 4.4 10*3/uL (ref 4.0–10.5)

## 2022-02-21 LAB — COMPREHENSIVE METABOLIC PANEL
ALT: 105 U/L — ABNORMAL HIGH (ref 0–53)
AST: 122 U/L — ABNORMAL HIGH (ref 0–37)
Albumin: 4.9 g/dL (ref 3.5–5.2)
Alkaline Phosphatase: 74 U/L (ref 39–117)
BUN: 7 mg/dL (ref 6–23)
CO2: 26 mEq/L (ref 19–32)
Calcium: 9 mg/dL (ref 8.4–10.5)
Chloride: 103 mEq/L (ref 96–112)
Creatinine, Ser: 0.9 mg/dL (ref 0.40–1.50)
GFR: 105.73 mL/min (ref >60.00)
Glucose, Bld: 92 mg/dL (ref 70–99)
Potassium: 4 mEq/L (ref 3.5–5.1)
Sodium: 142 mEq/L (ref 135–145)
Total Bilirubin: 0.8 mg/dL (ref 0.2–1.2)
Total Protein: 7.8 g/dL (ref 6.0–8.3)

## 2022-02-21 LAB — B12 AND FOLATE PANEL
Folate: 5.7 ng/mL — ABNORMAL LOW (ref >5.9)
Vitamin B-12: 252 pg/mL (ref 211–911)

## 2022-02-21 NOTE — Assessment & Plan Note (Signed)
Here for CPX HTN: On amlodipine, BP is good. Anxiety: Tried  citalopram for few days, states made his symptoms worse.  "The only thing that works is Xanax". Advised patient Xanax is contraindicated due to his history of alcohol abuse.  We talk about other options and we agreed on BuSpar 7.5 mg twice daily.  Reassess in 6 weeks. Alcohol abuse: The patient reports that he "drinks because he is anxious", advised he has a condition called alcoholism, is not just anxiety.  As such, in addition to treat anxiety he needs to seek for help to quit alcohol.  Several contacts provided, see AVS. He verbalized understanding and said "I know that". He drinks approximately 6 beers a day, he had a drink this morning before coming to the office.  Advised about drinking and driving. RTC 6 weeks.

## 2022-02-21 NOTE — Assessment & Plan Note (Signed)
Tdap 2014   Since I recommend: COVID, flu shot. Pros >cons CCS: no FH, never had a Cscope FH prostate ca, GF and F at age 41.  No symptoms Labs:  CMP, FLP, CBC, K24, folic acid Diet exercise: Discussed.

## 2022-02-22 MED ORDER — FOLIC ACID 1 MG PO TABS: 1.0000 mg | ORAL_TABLET | Freq: Every day | ORAL | 3 refills | Status: DC

## 2022-02-22 NOTE — Addendum Note (Signed)
Addended byDamita Dunnings D on: 02/22/2022 07:58 AM   Modules accepted: Orders

## 2022-04-03 ENCOUNTER — Encounter: Payer: Self-pay | Admitting: Internal Medicine

## 2022-04-03 ENCOUNTER — Ambulatory Visit (INDEPENDENT_AMBULATORY_CARE_PROVIDER_SITE_OTHER): Payer: 59 | Admitting: Internal Medicine

## 2022-04-03 VITALS — BP 148/92 | HR 86 | Temp 98.1°F | Resp 16 | Ht 69.0 in | Wt 191.5 lb

## 2022-04-03 DIAGNOSIS — F1010 Alcohol abuse, uncomplicated: Secondary | ICD-10-CM | POA: Diagnosis not present

## 2022-04-03 DIAGNOSIS — F419 Anxiety disorder, unspecified: Secondary | ICD-10-CM | POA: Diagnosis not present

## 2022-04-03 DIAGNOSIS — R7989 Other specified abnormal findings of blood chemistry: Secondary | ICD-10-CM

## 2022-04-03 LAB — HEPATIC FUNCTION PANEL
ALT: 116 U/L — ABNORMAL HIGH (ref 0–53)
AST: 81 U/L — ABNORMAL HIGH (ref 0–37)
Albumin: 4.9 g/dL (ref 3.5–5.2)
Alkaline Phosphatase: 70 U/L (ref 39–117)
Bilirubin, Direct: 0.2 mg/dL (ref 0.0–0.3)
Total Bilirubin: 1.1 mg/dL (ref 0.2–1.2)
Total Protein: 7.5 g/dL (ref 6.0–8.3)

## 2022-04-03 NOTE — Progress Notes (Unsigned)
   Subjective:    Patient ID: James Trujillo, male    DOB: 04/13/1981, 41 y.o.   MRN: [id number]  DOS:  04/03/2022 Type of visit - description: Follow-up  To follow-up from previous visit We talk about EtOH, anxiety, LFTs. He is making some progress.   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 HEENT:  Normocephalic . Face symmetric, atraumatic Lower extremities: no pretibial edema bilaterally  Skin: Not pale. Not jaundice Neurologic:  alert & oriented X3.  Speech normal, gait appropriate for age and unassisted Psych--  Cognition and judgment appear intact.  Cooperative with normal attention span and concentration.  Behavior appropriate. Slightly anxious, no depressed appearing.      Assessment     ASSESSMENT (new patient 10/2020) HTN dx 08/2020  ETOH abuse FH prostate ca, F age 70 Covid infex x 2 (last 05/2020) Dupuytren's  PLAN Anxiety: The patient tried BuSpar, was very sleepy and self discontinue.  His mental state however has improved significantly since he is drinking less and exercising more.  I recommend no further trial with medication at this point. Alcohol abuse: Today he reports that 10 years ago he attended outpatient rehab facility and went to [facility]. Since the last visit, he opened up to his family about his  alcohol abuse, they have been very supportive.  He is drinking much less, 2-3 drinks a day.  Although he has not quit I praised him for his efforts.  He would like some counseling, information provided. Increased LFTs: Last LFTs were quite elevated, does not take Tylenol, he is drinking less, recheck LFTs. Folic acid deficiency: Labs from the low folic acid low side normal B12.  On a multivitamin and folic acid.  Think the addition of vitamins has helped him RTC 4 months

## 2022-04-03 NOTE — Patient Instructions (Signed)
You are doing a great job.  You are on the right path. Seen a counselor is a great idea.  GO TO THE LAB : Get the blood work     GO TO THE FRONT DESK, [place] Come back for checkup in 4 months

## 2022-04-04 NOTE — Assessment & Plan Note (Signed)
Anxiety: The patient tried BuSpar, was very sleepy and self discontinue.  His mental state however has improved significantly since he is drinking less and exercising more.  I recommend no further trial with medication at this point. Alcohol abuse: Today he reports that 10 years ago he attended outpatient rehab facility and went to [facility]. Since the last visit, he opened up to his family about his alcohol abuse, they have been very supportive.  He is drinking much less, 2-3 drinks a day.  Although he has not quit I praised him for his efforts.  He would like some counseling, information provided. Increased LFTs: Last LFTs were quite elevated, does not take Tylenol, he is drinking less, recheck LFTs. Folic acid deficiency: Labs from the low folic acid low side normal B12.  On a multivitamin and folic acid.  Think the addition of vitamins has helped him RTC 4 months

## 2022-07-06 ENCOUNTER — Other Ambulatory Visit: Payer: Self-pay

## 2022-07-06 ENCOUNTER — Encounter (HOSPITAL_BASED_OUTPATIENT_CLINIC_OR_DEPARTMENT_OTHER): Payer: Self-pay

## 2022-07-06 ENCOUNTER — Emergency Department (HOSPITAL_BASED_OUTPATIENT_CLINIC_OR_DEPARTMENT_OTHER)
Admission: EM | Admit: 2022-07-06 | Discharge: 2022-07-06 | Disposition: A | Discharge status: Home / Self Care | Payer: 59 | Attending: Emergency Medicine | Admitting: Emergency Medicine

## 2022-07-06 DIAGNOSIS — W260XXA Contact with knife, initial encounter: Secondary | ICD-10-CM | POA: Diagnosis not present

## 2022-07-06 DIAGNOSIS — S61212A Laceration without foreign body of right middle finger without damage to nail, initial encounter: Secondary | ICD-10-CM | POA: Insufficient documentation

## 2022-07-06 DIAGNOSIS — Z23 Encounter for immunization: Secondary | ICD-10-CM | POA: Diagnosis not present

## 2022-07-06 DIAGNOSIS — Y93G3 Activity, cooking and baking: Secondary | ICD-10-CM | POA: Diagnosis not present

## 2022-07-06 DIAGNOSIS — S65502A Unspecified injury of blood vessel of right middle finger, initial encounter: Secondary | ICD-10-CM | POA: Diagnosis present

## 2022-07-06 MED ORDER — TETANUS-DIPHTH-ACELL PERTUSSIS 5-2.5-18.5 LF-MCG/0.5 IM SUSY
0.5000 mL | PREFILLED_SYRINGE | Freq: Once | INTRAMUSCULAR | Status: AC
  Administered 2022-07-06: 0.5 mL via INTRAMUSCULAR
  Filled 2022-07-06: qty 0.5

## 2022-07-06 NOTE — ED Notes (Signed)
RN went to discharge pt and he was not in room.

## 2022-07-06 NOTE — Discharge Instructions (Addendum)
Please apply Neosporin to the affected area, keep a dressing over the wound to decrease risk of infection, if you notice any increasing pain redness please return

## 2022-07-06 NOTE — ED Triage Notes (Signed)
Pt reports he cut his anterior right middle finger with a kitchen knife tonight about 6pm when chopping an avocado. He put super glue on it. Last tetanus unknown.

## 2022-07-06 NOTE — ED Notes (Signed)
Pt. Placed super glu on his R middle finger after he cut his finger at the lower joint.  No bleeding noted and no pain per Pt.   No redness around the wound.

## 2022-07-06 NOTE — ED Provider Notes (Signed)
[facility] [facility] Provider Note   CSN: [id number] Arrival date & time: 07/06/22  1926     History  Chief Complaint  Patient presents with   Extremity Laceration    James Trujillo is a 42 y.o. male.  The history is provided by the patient and medical records. No language interpreter was used.     42 year old male presenting for evaluation of hand injury.  Patient was cooking tonight, accidentally cut his right middle finger with a knife while he was cutting avocado.  This is on his right dominant hand.  Patient states he was able to wash the wound thoroughly and continues to cook however it keep on bleeding for approximately 2 hours.  Eventually he applied superglue to the area and presented to the ED.  Since then his he mention bleeding has stopped.  He denies any associate numbness.  He is unsure his last tetanus status.  Denies any significant pain.  Home Medications Prior to Admission medications   Medication Sig Start Date End Date Taking? Authorizing Provider  amLODipine (NORVASC) 5 MG tablet Take 1 tablet (5 mg total) by mouth daily. 01/06/22   Colon Branch, MD  folic acid (FOLVITE) 1 MG tablet Take 1 tablet (1 mg total) by mouth daily. 02/22/22   Colon Branch, MD      Allergies    Bee venom and Ciprofloxacin    Review of Systems   Review of Systems  All other systems reviewed and are negative.   Physical Exam Updated Vital Signs BP (!) 125/99 (BP Location: Left Arm)   Pulse 90   Temp 97.8 F (36.6 C) (Oral)   Resp 18   Ht 5' 9"$  (1.753 m)   Wt 86.2 kg   SpO2 98%   BMI 28.06 kg/m  Physical Exam Vitals and nursing note reviewed.  Constitutional:      General: He is not in acute distress.    Appearance: He is well-developed.  HENT:     Head: Atraumatic.  Eyes:     Conjunctiva/sclera: Conjunctivae normal.  Musculoskeletal:        General: Signs of injury (Right hand, middle finger: There is a less than 1 cm superficial  laceration noted to the MCP on the volar aspect with superglue in place. no bleeding.  Able to flex and extend finger with full sensation and brisk cap refill.) present.     Cervical back: Neck supple.  Skin:    Findings: No rash.  Neurological:     Mental Status: He is alert.     ED Results / Procedures / Treatments   Labs (all labs ordered are listed, but only abnormal results are displayed) Labs Reviewed - No data to display  EKG None  Radiology No results found.  Procedures Procedures    Medications Ordered in ED Medications - No data to display  ED Course/ Medical Decision Making/ A&P                             Medical Decision Making  BP (!) 125/99 (BP Location: Left Arm)   Pulse 90   Temp 97.8 F (36.6 C) (Oral)   Resp 18   Ht 5' 9"$  (1.753 m)   Wt 86.2 kg   SpO2 98%   BMI 28.06 kg/m   39:5 PM   42 year old male presenting for evaluation of hand injury.  Patient was cooking tonight,  accidentally cut his right middle finger with a knife while he was cutting avocado.  This is on his right dominant hand.  Patient states he was able to wash the wound thoroughly and continues to cook however it keep on bleeding for approximately 2 hours.  Eventually he applied superglue to the area and presented to the ED.  Since then his he mention bleeding has stopped.  He denies any associate numbness.  He is unsure his last tetanus status.  Denies any significant pain.  On exam patient's right dominant hand with a small superficial laceration at the MCP to the middle finger on the volar aspect.  Superglue was in place and no active bleeding.  Patient is able to flex and extend finger without difficulty and sensation is intact.  Have low suspicion for tendon injury or vascular injury.  I have no suspicion for nerve injury.  I do not think patient need additional laceration repair.  I will provide patient with appropriate wound care instruction such as apply Neosporin and dressing  to the wound and monitor for any signs of infection.  Will update patient's tetanus.  Otherwise patient is stable for discharged home.  Imaging including x-ray considered but did not order as I have low suspicion for fracture or dislocation.  Doubt retained foreign body.        Final Clinical Impression(s) / ED Diagnoses Final diagnoses:  Laceration of right middle finger without foreign body without damage to nail, initial encounter    Rx / DC Orders ED Discharge Orders     None         Domenic Moras, PA-C 07/06/22 2215    Elgie Congo, MD 07/07/22 [phone]

## 2022-07-07 ENCOUNTER — Telehealth: Payer: Self-pay

## 2022-07-07 NOTE — Telephone Encounter (Signed)
Transition Care Management Follow-up Telephone Call Date of discharge and from where: [facility] 07/06/2022 How have you been since you were released from the hospital? "I'm perfectly fine. It will heal" Any questions or concerns? No  Items Reviewed: Did the pt receive and understand the discharge instructions provided? Yes  Medications obtained and verified? No  Other? No  Any new allergies since your discharge? No  Dietary orders reviewed? No Do you have support at home? Yes   Follow up appointments reviewed:  PCP Hospital f/u appt confirmed?  Declines f/u visit at this time.

## 2022-08-02 ENCOUNTER — Ambulatory Visit: Payer: 59 | Admitting: Internal Medicine

## 2022-08-28 ENCOUNTER — Ambulatory Visit: Payer: 59 | Admitting: Internal Medicine

## 2022-12-18 ENCOUNTER — Encounter: Payer: Self-pay | Admitting: Internal Medicine

## 2022-12-18 ENCOUNTER — Ambulatory Visit: Payer: Commercial Managed Care - PPO | Admitting: Internal Medicine

## 2022-12-18 ENCOUNTER — Telehealth: Payer: Self-pay

## 2022-12-18 VITALS — BP 136/86 | HR 75 | Temp 97.6°F | Resp 16 | Ht 69.0 in | Wt 191.5 lb

## 2022-12-18 DIAGNOSIS — J342 Deviated nasal septum: Secondary | ICD-10-CM

## 2022-12-18 DIAGNOSIS — I10 Essential (primary) hypertension: Secondary | ICD-10-CM | POA: Diagnosis not present

## 2022-12-18 DIAGNOSIS — R7989 Other specified abnormal findings of blood chemistry: Secondary | ICD-10-CM | POA: Diagnosis not present

## 2022-12-18 DIAGNOSIS — F419 Anxiety disorder, unspecified: Secondary | ICD-10-CM | POA: Diagnosis not present

## 2022-12-18 DIAGNOSIS — Z1283 Encounter for screening for malignant neoplasm of skin: Secondary | ICD-10-CM | POA: Diagnosis not present

## 2022-12-18 DIAGNOSIS — G4709 Other insomnia: Secondary | ICD-10-CM

## 2022-12-18 DIAGNOSIS — F1010 Alcohol abuse, uncomplicated: Secondary | ICD-10-CM

## 2022-12-18 LAB — AST: AST: 45 U/L — ABNORMAL HIGH (ref 0–37)

## 2022-12-18 LAB — BASIC METABOLIC PANEL
BUN: 10 mg/dL (ref 6–23)
CO2: 25 mEq/L (ref 19–32)
Calcium: 9.6 mg/dL (ref 8.4–10.5)
Chloride: 100 mEq/L (ref 96–112)
Creatinine, Ser: 0.98 mg/dL (ref 0.40–1.50)
GFR: 94.91 mL/min (ref >60.00)
Glucose, Bld: 93 mg/dL (ref 70–99)
Potassium: 4 mEq/L (ref 3.5–5.1)
Sodium: 138 mEq/L (ref 135–145)

## 2022-12-18 LAB — ALT: ALT: 38 U/L (ref 0–53)

## 2022-12-18 MED ORDER — HYDROXYZINE HCL 10 MG PO TABS: 10.0000 mg | ORAL_TABLET | Freq: Every evening | ORAL | 0 refills | Status: DC | PRN

## 2022-12-18 NOTE — Progress Notes (Unsigned)
   Subjective:    Patient ID: James Trujillo, male    DOB: 06-06-1980, 42 y.o.   MRN: [id number]  DOS:  12/18/2022 Type of visit - description: f/u  Here w/ his fiancee Multiple issues d/w them  Review of Systems See above   Past Medical History:  Diagnosis Date   Alcohol-induced sleep disorder (HCC)    Anxiety    Essential hypertension    ETOH abuse    Vitamin D deficiency     Past Surgical History:  Procedure Laterality Date   NO PAST SURGERIES      Current Outpatient Medications  Medication Instructions   amLODipine (NORVASC) 5 mg, Oral, Daily   folic acid (FOLVITE) 1 mg, Oral, Daily   hydrOXYzine (ATARAX) 10-20 mg, Oral, At bedtime PRN       Objective:   Physical Exam BP 136/86   Pulse 75   Temp 97.6 F (36.4 C) (Oral)   Resp 16   Ht 5\' 9"  (1.753 m)   Wt 191 lb 8 oz (86.9 kg)   SpO2 96%   BMI 28.28 kg/m  General:   Well developed, NAD, BMI noted. HEENT:  Normocephalic . Face symmetric, atraumatic  Lower extremities: no pretibial edema bilaterally  Skin: Not pale. Not jaundice Neurologic:  alert & oriented X3.  Speech normal, gait appropriate for age and unassisted Psych--  Cognition and judgment appear intact.  Cooperative with normal attention span and concentration.  Behavior appropriate. No anxious or depressed appearing.      Assessment     ASSESSMENT (new patient 10/2020) HTN dx 08/2020 ETOH abuse Anxiety-insomnia FH prostate ca, F age 89 Dupuytren's  PLAN Here with his fiance who provided a lot of information HTN: Well-controlled on amlodipine, check BMP EtOH: Since last visit drinking has been on and off, sometimes no alcohol, sometimes 2 drinks a day, sometimes he relapses.  Words of encouragement provided to the patient.  He is aware this is a lifelong disease. Anxiety-insomnia: The patient tells me that this has been a long struggle for him.  Previously Celexa and buspirone were self discontinued. The patient and his fiance  request Genesight, genetic test to determine what medication could be better tolerated. Also, reportedly often times he drinks to help him sleep but at the same time alcohol make anxiety worse. Plan:  -Genesight test  per patient request, is very costly test but they said they are willing to pay for it if not covered. -Trial with Atarax. -Consider referral to psychiatry given the complexity of his problems.   Increased LFTs: In the context of EtOH abuse, last drink was 3 days ago, recheck labs Deviated septum: Request ENT referral Dermatology referral: Concerned about cancer, would like a complete checkup.  Request a referral, sent.   RTC CPX 4 months.

## 2022-12-18 NOTE — Telephone Encounter (Signed)
Genesight sent by [employer].

## 2022-12-18 NOTE — Telephone Encounter (Signed)
Pt was seen in office today. Genesight test was collected/ordered. Sample was packaged and placed up front for [employer] to pick up 12/18/22. Consent and insurance info were in the package.   Confirmation number for pickup is [id number]  Tracking number (on package): [phone]

## 2022-12-18 NOTE — Patient Instructions (Addendum)
At your request, will send genesight.  Please be aware of the cost.   Try Atarax 10 mg 1 tablet at bedtime as needed for difficulty sleeping. You can increase to 2 tablets at bedtime if needed.  Vaccines I recommend: Flu shot this fall   Check the  blood pressure regularly BP GOAL is between 110/65 and  135/85. If it is consistently higher or lower, let me know      GO TO THE LAB : Get the blood work     GO TO THE FRONT DESK, PLEASE SCHEDULE YOUR APPOINTMENTS Come back for a physical exam in 4 months

## 2022-12-19 DIAGNOSIS — G4709 Other insomnia: Secondary | ICD-10-CM | POA: Insufficient documentation

## 2022-12-19 DIAGNOSIS — F1010 Alcohol abuse, uncomplicated: Secondary | ICD-10-CM | POA: Insufficient documentation

## 2022-12-19 NOTE — Assessment & Plan Note (Signed)
Here with his fiance who provided a lot of information HTN: Well-controlled on amlodipine, check BMP EtOH: Since last visit drinking has been on and off, sometimes no alcohol, sometimes 2 drinks a day, sometimes he relapses.  Words of encouragement provided to the patient.  He is aware this is a lifelong disease. Anxiety-insomnia: The patient tells me that this has been a long struggle for him.  Previously Celexa and buspirone were self discontinued. The patient and his fiance request Genesight, genetic test to determine what medication could be better tolerated. Also, reportedly often times he drinks to help him sleep but at the same time alcohol make anxiety worse. Plan:  -Genesight test  per patient request, is very costly test but they said they are willing to pay for it if not covered. -Trial with Atarax. -Consider referral to psychiatry given the complexity of his problems.   Increased LFTs: In the context of EtOH abuse, last drink was 3 days ago, recheck labs Deviated septum: Request ENT referral Dermatology referral: Concerned about cancer, would like a complete checkup.  Request a referral, sent.   RTC CPX 4 months.

## 2022-12-20 NOTE — Telephone Encounter (Signed)
Advise patient: - Liver test much improved. -GeneSight results reviewed.  Pristiq, Fetzima and Viibryd are good options. (Although Pristiq is for depression is also commonly used for anxiety). Please call patient, after reviewing his results I recommend: - Pristiq 50 mg 1 tablet daily, send a month supply. - To let me know in 3 weeks how he is doing, be aware of potential suicidal ideas, if that is the case he needs to seek help immediately - Sending him a copy of the results

## 2022-12-20 NOTE — Telephone Encounter (Signed)
Results received from [facility]- placed in PCP red folder for review.

## 2022-12-21 MED ORDER — DESVENLAFAXINE SUCCINATE ER 50 MG PO TB24: 50.0000 mg | ORAL_TABLET | Freq: Every day | ORAL | 0 refills | Status: DC

## 2022-12-21 NOTE — Telephone Encounter (Signed)
LMOM informing Pt of results and recommendations. Informed that when we ordered Genesight test that we put his email address in so hopefully they did send him a copy of report but if not let me know. Rx sent to [facility].

## 2023-01-09 ENCOUNTER — Telehealth: Payer: Self-pay | Admitting: Internal Medicine

## 2023-01-09 NOTE — Telephone Encounter (Signed)
Pt brought in letter sent to him in the mail requesting more info. Pt was told that we most likely have received the same letter via fax but would give a copy to his PCP so records can be sent for claim. Paper placed in PCP Tray in front office, please call pt when completed.

## 2023-01-12 NOTE — Telephone Encounter (Signed)
Placed on Angela's desk.

## 2023-01-12 NOTE — Telephone Encounter (Signed)
I have faxed requested paperwork and have notified the patient that this has been done.

## 2023-01-16 ENCOUNTER — Other Ambulatory Visit: Payer: Self-pay | Admitting: Internal Medicine

## 2023-01-28 ENCOUNTER — Other Ambulatory Visit: Payer: Self-pay | Admitting: Internal Medicine

## 2023-02-06 NOTE — Telephone Encounter (Signed)
Mychart message sent to Pt. Informing below.

## 2023-02-06 NOTE — Telephone Encounter (Signed)
Pt states he never received a message or an email about his results and would like the nurse to call him again.

## 2023-02-07 NOTE — Telephone Encounter (Signed)
Last read by Susa Raring at  4:57 PM on 02/06/2023

## 2023-02-27 ENCOUNTER — Other Ambulatory Visit: Payer: Self-pay | Admitting: Internal Medicine

## 2023-04-20 ENCOUNTER — Encounter: Payer: Commercial Managed Care - PPO | Admitting: Internal Medicine

## 2023-06-13 ENCOUNTER — Encounter: Payer: Commercial Managed Care - PPO | Admitting: Internal Medicine

## 2023-07-13 ENCOUNTER — Encounter: Payer: Commercial Managed Care - PPO | Admitting: Internal Medicine

## 2023-08-15 ENCOUNTER — Encounter: Payer: Commercial Managed Care - PPO | Admitting: Internal Medicine

## 2023-08-28 ENCOUNTER — Other Ambulatory Visit: Payer: Self-pay | Admitting: Internal Medicine

## 2023-09-10 ENCOUNTER — Encounter: Admitting: Internal Medicine

## 2023-10-01 ENCOUNTER — Encounter: Payer: Self-pay | Admitting: Internal Medicine

## 2023-10-01 ENCOUNTER — Ambulatory Visit (INDEPENDENT_AMBULATORY_CARE_PROVIDER_SITE_OTHER): Admitting: Internal Medicine

## 2023-10-01 VITALS — BP 126/82 | HR 83 | Temp 98.2°F | Resp 16 | Ht 69.0 in | Wt 204.0 lb

## 2023-10-01 DIAGNOSIS — Z0000 Encounter for general adult medical examination without abnormal findings: Secondary | ICD-10-CM

## 2023-10-01 DIAGNOSIS — I10 Essential (primary) hypertension: Secondary | ICD-10-CM | POA: Diagnosis not present

## 2023-10-01 DIAGNOSIS — G4709 Other insomnia: Secondary | ICD-10-CM | POA: Diagnosis not present

## 2023-10-01 DIAGNOSIS — F1010 Alcohol abuse, uncomplicated: Secondary | ICD-10-CM | POA: Diagnosis not present

## 2023-10-01 NOTE — Patient Instructions (Signed)
 INSTRUCTIONS  FOR TODAY  Consider to see a counselor   Check the  blood pressure regularly Blood pressure goal:  between 110/65 and  135/85. If it is consistently higher or lower, let me know     GO TO THE LAB : Get the blood work     Next office visit for a checkup in 4 months Please make an appointment before you leave today

## 2023-10-01 NOTE — Progress Notes (Unsigned)
 Subjective:    Patient ID: James Trujillo, male    DOB: [birth year], 43 y.o.   MRN: [id number]  DOS:  10/01/2023 Type of visit - description: CPX  Here for CPX. Chronic medical problems addressed. Feeling well.   Review of Systems See above   Past Medical History:  Diagnosis Date   Alcohol-induced sleep disorder (HCC)    Anxiety    Essential hypertension    ETOH abuse    Vitamin D deficiency     Past Surgical History:  Procedure Laterality Date   NO PAST SURGERIES      Current Outpatient Medications  Medication Instructions   amLODipine  (NORVASC ) 5 mg, Oral, Daily   desvenlafaxine  (PRISTIQ ) 50 mg, Oral, Daily   folic acid  (FOLVITE ) 1 mg, Oral, Daily   hydrOXYzine  (ATARAX ) 10-20 mg, Oral, At bedtime PRN       Objective:   Physical Exam BP 126/82   Pulse 83   Temp 98.2 F (36.8 C) (Oral)   Resp 16   Ht 5\' 9"  (1.753 m)   Wt 204 lb (92.5 kg)   SpO2 96%   BMI 30.13 kg/m  General: Well developed, NAD, BMI noted Neck: No  thyromegaly  HEENT:  Normocephalic . Face symmetric, atraumatic Lungs:  CTA B Normal respiratory effort, no intercostal retractions, no accessory muscle use. Heart: RRR,  no murmur.  Abdomen:  Not distended, soft, non-tender. No rebound or rigidity.   Lower extremities: no pretibial edema bilaterally  Skin: Exposed areas without rash. Not pale. Not jaundice Neurologic:  alert & oriented X3.  Speech normal, gait appropriate for age and unassisted Strength symmetric and appropriate for age.  Psych: Cognition and judgment appear intact.  Cooperative with normal attention span and concentration.  Behavior appropriate. No anxious or depressed appearing.     Assessment    ASSESSMENT (new patient 10/2020) HTN dx 08/2020 ETOH abuse Anxiety-insomnia FH prostate ca, F age 89 Dupuytren's  PLAN  Tdap 2024   Vaccines I recommend: Flu shot every fall, COVID booster if so desired. CCS: no FH, start at age 36 FH prostate ca, GF and F at  age 35.  No symptoms. Labs:  CMP FLP CBC PT PTT B12, thiamine, folic acid . Diet exercise: Discussed. Other issues HTN: On amlodipine , no side effects, BP is good, check labs, monitor BPs. Anxiety/insomnia: See LOV, got a Gene Sight >>   Pristiq , Fetzima and Viibryd were  good options thus prescribed Pristiq  50 mg 1 tablet daily and is working really well for him.  No change. EtOH: Was dry January February or March, then got a new job, did some work travel had few drinks at night.  Advised patient > ETOH abuse  is a lifelong disease, consider AA but he states that counseling works better for him, encouraged to pursue it. He is concerned about long-term effects of EtOH, checking CBC PTT PT and vitamins. RTC 4 months.     7/22 Here with his fiance who provided a lot of information HTN: Well-controlled on amlodipine , check BMP EtOH: Since last visit drinking has been on and off, sometimes no alcohol, sometimes 2 drinks a day, sometimes he relapses.  Words of encouragement provided to the patient.  He is aware this is a lifelong disease. Anxiety-insomnia: The patient tells me that this has been a long struggle for him.  Previously Celexa  and buspirone  were self discontinued. The patient and his fiance request Genesight, genetic test to determine what medication could be better tolerated. Also,  reportedly often times he drinks to help him sleep but at the same time alcohol make anxiety worse. Plan:  -Genesight test  per patient request, is very costly test but they said they are willing to pay for it if not covered. -Trial with Atarax . -Consider referral to psychiatry given the complexity of his problems.   Increased LFTs: In the context of EtOH abuse, last drink was 3 days ago, recheck labs Deviated septum: Request ENT referral Dermatology referral: Concerned about cancer, would like a complete checkup.  Request a referral, sent.   RTC CPX 4 months.

## 2023-10-02 ENCOUNTER — Telehealth: Payer: Self-pay | Admitting: *Deleted

## 2023-10-02 ENCOUNTER — Encounter: Payer: Self-pay | Admitting: Internal Medicine

## 2023-10-02 LAB — CBC WITH DIFFERENTIAL/PLATELET
Basophils Absolute: 0.1 10*3/uL (ref 0.0–0.1)
Basophils Relative: 0.9 % (ref 0.0–3.0)
Eosinophils Absolute: 0 10*3/uL (ref 0.0–0.7)
Eosinophils Relative: 0.7 % (ref 0.0–5.0)
HCT: 46.7 % (ref 39.0–52.0)
Hemoglobin: 15.9 g/dL (ref 13.0–17.0)
Lymphocytes Relative: 21.6 % (ref 12.0–46.0)
Lymphs Abs: 1.5 10*3/uL (ref 0.7–4.0)
MCHC: 34 g/dL (ref 30.0–36.0)
MCV: 96.7 fl (ref 78.0–100.0)
Monocytes Absolute: 0.7 10*3/uL (ref 0.1–1.0)
Monocytes Relative: 9.6 % (ref 3.0–12.0)
Neutro Abs: 4.8 10*3/uL (ref 1.4–7.7)
Neutrophils Relative %: 67.2 % (ref 43.0–77.0)
Platelets: 303 10*3/uL (ref 150.0–400.0)
RBC: 4.83 Mil/uL (ref 4.22–5.81)
RDW: 15.1 % (ref 11.5–15.5)
WBC: 7.1 10*3/uL (ref 4.0–10.5)

## 2023-10-02 LAB — LIPID PANEL
Cholesterol: 280 mg/dL — ABNORMAL HIGH (ref 0–200)
HDL: 81.3 mg/dL (ref >39.00)
LDL Cholesterol: 165 mg/dL — ABNORMAL HIGH (ref 0–99)
NonHDL: 198.2
Total CHOL/HDL Ratio: 3
Triglycerides: 168 mg/dL — ABNORMAL HIGH (ref 0.0–149.0)
VLDL: 33.6 mg/dL (ref 0.0–40.0)

## 2023-10-02 LAB — COMPREHENSIVE METABOLIC PANEL WITH GFR
ALT: 28 U/L (ref 0–53)
AST: 28 U/L (ref 0–37)
Albumin: 4.7 g/dL (ref 3.5–5.2)
Alkaline Phosphatase: 58 U/L (ref 39–117)
BUN: 11 mg/dL (ref 6–23)
CO2: 29 meq/L (ref 19–32)
Calcium: 9.7 mg/dL (ref 8.4–10.5)
Chloride: 99 meq/L (ref 96–112)
Creatinine, Ser: 0.97 mg/dL (ref 0.40–1.50)
GFR: 95.56 mL/min (ref >60.00)
Glucose, Bld: 78 mg/dL (ref 70–99)
Potassium: 4.1 meq/L (ref 3.5–5.1)
Sodium: 138 meq/L (ref 135–145)
Total Bilirubin: 0.9 mg/dL (ref 0.2–1.2)
Total Protein: 7.6 g/dL (ref 6.0–8.3)

## 2023-10-02 LAB — B12 AND FOLATE PANEL
Folate: 24.5 ng/mL (ref >5.9)
Vitamin B-12: 267 pg/mL (ref 211–911)

## 2023-10-02 NOTE — Assessment & Plan Note (Signed)
 Here for CPX  Other issues HTN: On amlodipine , no side effects, BP is good, check labs, monitor BPs. Anxiety/insomnia: See LOV, got a test called Gene Sight >> based on results was prescribed Pristiq  50 mg 1 tablet daily and is working really well for him.  No change. EtOH: Was dry January February or March, then got a new job, did some work travel and had few drinks at night.  Advised patient > ETOH abuse  is a lifelong disease, consider AA but he states that counseling works better for him, encouraged to pursue counseling. He is concerned about long-term effects of EtOH, checking CBC PTT PT and vitamins. RTC 4 months.

## 2023-10-02 NOTE — Telephone Encounter (Signed)
 Pt in office yesterday.  There was an error in processing specimen for PT/INR, PTT.  Pt will return 5/7 around 8:30 for redraw.

## 2023-10-02 NOTE — Assessment & Plan Note (Signed)
 Here for CPX Tdap 2024   Vaccines I recommend: Flu shot every fall, COVID booster if so desired. CCS: no FH, start at age 43 FH prostate ca, GF and F at age 68.  No symptoms. Labs:  CMP FLP CBC PT PTT B12, thiamine, folic acid . Diet exercise: Discussed.

## 2023-10-03 ENCOUNTER — Other Ambulatory Visit

## 2023-10-04 LAB — VITAMIN B1: Vitamin B1 (Thiamine): 7 nmol/L — ABNORMAL LOW (ref 8–30)

## 2023-10-05 ENCOUNTER — Other Ambulatory Visit (INDEPENDENT_AMBULATORY_CARE_PROVIDER_SITE_OTHER)

## 2023-10-05 DIAGNOSIS — F1010 Alcohol abuse, uncomplicated: Secondary | ICD-10-CM

## 2023-10-05 LAB — PROTIME-INR
INR: 1.1 ratio — ABNORMAL HIGH (ref 0.8–1.0)
Prothrombin Time: 11.3 s (ref 9.6–13.1)

## 2023-10-05 LAB — APTT: aPTT: 30.7 s (ref 25.4–36.8)

## 2023-10-07 ENCOUNTER — Encounter: Payer: Self-pay | Admitting: Internal Medicine

## 2024-02-15 ENCOUNTER — Ambulatory Visit: Admitting: Internal Medicine

## 2024-02-24 ENCOUNTER — Other Ambulatory Visit: Payer: Self-pay | Admitting: Internal Medicine

## 2024-04-11 ENCOUNTER — Ambulatory Visit: Admitting: Internal Medicine
